# Patient Record
Sex: Female | Born: 1945 | Race: White | Hispanic: No | Marital: Married | Smoking: Never smoker
Health system: Southern US, Community
[De-identification: ages and names within clinical notes are randomized; demographics above are authoritative.]

## PROBLEM LIST (undated history)

## (undated) DIAGNOSIS — I10 Essential (primary) hypertension: Secondary | ICD-10-CM

## (undated) DIAGNOSIS — R011 Cardiac murmur, unspecified: Secondary | ICD-10-CM

## (undated) DIAGNOSIS — R002 Palpitations: Secondary | ICD-10-CM

## (undated) DIAGNOSIS — F419 Anxiety disorder, unspecified: Secondary | ICD-10-CM

## (undated) DIAGNOSIS — M1990 Unspecified osteoarthritis, unspecified site: Secondary | ICD-10-CM

## (undated) HISTORY — PX: ABDOMINAL HYSTERECTOMY: SHX81

## (undated) HISTORY — PX: WRIST SURGERY: SHX841

## (undated) HISTORY — DX: Palpitations: R00.2

## (undated) HISTORY — PX: APPENDECTOMY: SHX54

## (undated) HISTORY — PX: CHOLECYSTECTOMY: SHX55

## (undated) HISTORY — PX: EYE SURGERY: SHX253

## (undated) HISTORY — PX: TONSILLECTOMY: SUR1361

---

## 1979-05-03 HISTORY — PX: BACK SURGERY: SHX140

## 1999-10-01 ENCOUNTER — Ambulatory Visit (HOSPITAL_COMMUNITY): Admission: RE | Admit: 1999-10-01 | Discharge: 1999-10-01 | Payer: Self-pay | Admitting: Gastroenterology

## 1999-10-27 ENCOUNTER — Encounter: Payer: Self-pay | Admitting: Internal Medicine

## 1999-10-27 ENCOUNTER — Encounter: Admission: RE | Admit: 1999-10-27 | Discharge: 1999-10-27 | Payer: Self-pay | Admitting: Internal Medicine

## 2000-10-27 ENCOUNTER — Encounter: Payer: Self-pay | Admitting: Internal Medicine

## 2000-10-27 ENCOUNTER — Encounter: Admission: RE | Admit: 2000-10-27 | Discharge: 2000-10-27 | Payer: Self-pay | Admitting: Internal Medicine

## 2000-11-13 ENCOUNTER — Inpatient Hospital Stay (HOSPITAL_COMMUNITY): Admission: EM | Admit: 2000-11-13 | Discharge: 2000-11-14 | Payer: Self-pay | Admitting: Internal Medicine

## 2001-09-21 ENCOUNTER — Encounter (INDEPENDENT_AMBULATORY_CARE_PROVIDER_SITE_OTHER): Payer: Self-pay | Admitting: Specialist

## 2001-09-21 ENCOUNTER — Ambulatory Visit (HOSPITAL_BASED_OUTPATIENT_CLINIC_OR_DEPARTMENT_OTHER): Admission: RE | Admit: 2001-09-21 | Discharge: 2001-09-21 | Payer: Self-pay

## 2001-10-29 ENCOUNTER — Encounter: Payer: Self-pay | Admitting: Internal Medicine

## 2001-10-29 ENCOUNTER — Encounter: Admission: RE | Admit: 2001-10-29 | Discharge: 2001-10-29 | Payer: Self-pay | Admitting: Internal Medicine

## 2002-10-31 ENCOUNTER — Encounter: Admission: RE | Admit: 2002-10-31 | Discharge: 2002-10-31 | Payer: Self-pay | Admitting: Internal Medicine

## 2002-10-31 ENCOUNTER — Encounter: Payer: Self-pay | Admitting: Internal Medicine

## 2003-02-22 ENCOUNTER — Emergency Department (HOSPITAL_COMMUNITY): Admission: EM | Admit: 2003-02-22 | Discharge: 2003-02-23 | Payer: Self-pay | Admitting: Emergency Medicine

## 2003-02-22 ENCOUNTER — Encounter: Payer: Self-pay | Admitting: Emergency Medicine

## 2003-02-23 ENCOUNTER — Observation Stay (HOSPITAL_COMMUNITY): Admission: EM | Admit: 2003-02-23 | Discharge: 2003-02-24 | Payer: Self-pay | Admitting: *Deleted

## 2003-02-23 ENCOUNTER — Encounter: Payer: Self-pay | Admitting: Orthopedic Surgery

## 2003-09-23 ENCOUNTER — Encounter: Admission: RE | Admit: 2003-09-23 | Discharge: 2003-09-23 | Payer: Self-pay | Admitting: Internal Medicine

## 2003-10-13 ENCOUNTER — Ambulatory Visit (HOSPITAL_COMMUNITY): Admission: RE | Admit: 2003-10-13 | Discharge: 2003-10-13 | Payer: Self-pay

## 2003-10-13 ENCOUNTER — Encounter (INDEPENDENT_AMBULATORY_CARE_PROVIDER_SITE_OTHER): Payer: Self-pay | Admitting: Specialist

## 2003-12-10 ENCOUNTER — Encounter: Admission: RE | Admit: 2003-12-10 | Discharge: 2003-12-10 | Payer: Self-pay | Admitting: Internal Medicine

## 2004-11-26 ENCOUNTER — Encounter: Admission: RE | Admit: 2004-11-26 | Discharge: 2004-11-26 | Payer: Self-pay | Admitting: Gastroenterology

## 2006-10-06 ENCOUNTER — Encounter: Admission: RE | Admit: 2006-10-06 | Discharge: 2006-10-06 | Payer: Self-pay | Admitting: Internal Medicine

## 2008-05-30 ENCOUNTER — Encounter: Admission: RE | Admit: 2008-05-30 | Discharge: 2008-05-30 | Payer: Self-pay | Admitting: Internal Medicine

## 2008-09-02 ENCOUNTER — Encounter: Admission: RE | Admit: 2008-09-02 | Discharge: 2008-09-02 | Payer: Self-pay | Admitting: Orthopedic Surgery

## 2009-11-17 ENCOUNTER — Encounter: Admission: RE | Admit: 2009-11-17 | Discharge: 2009-11-17 | Payer: Self-pay | Admitting: Internal Medicine

## 2010-05-23 ENCOUNTER — Encounter: Payer: Self-pay | Admitting: Internal Medicine

## 2010-09-17 NOTE — Op Note (Signed)
NAME:  Shannon [name], Shannon [name]                         ACCOUNT NO.:  [id number]   MEDICAL RECORD NO.:  [id number]                   PATIENT TYPE:  AMB   LOCATION:  DAY                                  FACILITY:  [facility]   PHYSICIAN:  Lorre Munroe., M.D.            DATE OF BIRTH:  1946-05-02   DATE OF PROCEDURE:  10/13/2003  DATE OF DISCHARGE:                                 OPERATIVE REPORT   PREOPERATIVE DIAGNOSIS:  Abdominal pain due to biliary dyskinesia.   POSTOPERATIVE DIAGNOSIS:  Abdominal pain due to biliary dyskinesia.   OPERATION:  Laparoscopic cholecystectomy with operative cholangiogram.   SURGEON:  Lebron Conners, M.D.   ASSISTANT:  Gita Kudo, M.D.   ANESTHESIA:  General.   PROCEDURE:  After the patient was monitored and anesthetized and had routine  preparation and draping of the abdomen, I infiltrated local anesthetic just  below the umbilicus and made a short transverse incision, located the  midline fascia, and cut it in the midline for about 1.5 cm, then bluntly  entered the peritoneal cavity with a Kelly clamp.  After assuring that there  were no adhesions of viscera to that area, I put in a Hasson cannula secured  by a pursestring 0 Vicryl suture in the fascia.  After inflation of the  abdomen with CO2 I performed laparoscopy, seeing no abnormalities.  The  gallbladder had a normal appearance. I  placed a 10 mm epigastric port and  two 5 mm midabdominal ports on the right through anesthetized sites under  direct vision.  I then placed the patient head up, foot down, and tilted to  the left and retracted the fundus of the gallbladder toward the right  shoulder and pulled the infundibulum laterally.  I dissected the  hepatoduodenal ligament until a large window existed both anteriorly and  posteriorly between the cystic duct, cystic artery, and liver through the  triangle of Calot.  I then placed a clip on the cystic duct as it emerged  from the gallbladder  and made a small hole in it and inserted a  cholangiogram catheter.  The fluoroscopic cholangiogram was completely  normal with normal-sized ducts and anatomy, no filling defects, and rapid,  easy flow into the duodenum.  I then removed the cholangiogram catheter and  restored the pneumoperitoneum and then removed the cholangiogram catheter  and clipped the cystic duct with three clips distally and cut it.  I then  clipped the cystic artery with three clips and cut between the two which  were closest to the gallbladder.  I dissected the gallbladder away from the  liver utilizing cautery and got hemostasis with the cautery.  I briefly  irrigated the area to remove blood clots and a small amount of bile that had  spilled with cholangiogram.  Hemostasis was good.  The clips were secure.  After detaching the gallbladder from the liver, I removed it through the  umbilical incision and tied the pursestring suture, then removed the  remaining irrigant from the lateral gutter and  removed the lateral ports under direct vision.  I removed the epigastric  port after allowing the CO2 to escape through it and then I closed all skin  incisions with intracuticular 4-0 Vicryl and Steri-Strips and applied  bandages.  She was stable through the procedure.                                               Lorre Munroe., M.D.    WB/MEDQ  D:  10/13/2003  T:  10/13/2003  Job:  [id number]   cc:   Candyce Churn, M.D.  [address] [place]  [place] [address]  Fax: [phone]   Tasia Catchings, M.D.  [address]  [place]  [place] [address]  Fax: [phone]

## 2010-09-17 NOTE — H&P (Signed)
Shannon [name]. [facility]  Patient:    Shannon [name], Shannon [name]                      MRN: [id number] Adm. Date:  [id number] Attending:  Pearla Dubonnet CC:         Pearla Dubonnet, M.D.   History and Physical  CHIEF COMPLAINT:  Nausea, vomiting, and diarrhea.  HISTORY OF PRESENT ILLNESS:  This 65 year old white female in good health woke up early Friday morning with vomiting and severe diarrhea.  She had a temperature of 101.  Her vomiting and diarrhea persisted throughout the day on Friday, and she went to the walk-in clinic Friday night and was told she had a virus and was given Phenergan.  On Saturday she felt better, but on Sunday morning she continued to have nausea, vomiting, and watery diarrhea.  She went to the walk-in clinic again where she was told she was a little dehydrated. She was given Phenergan and Compazine suppositories.  She had a KUB and blood work done at that time.  Today, she developed increased abdominal distention in her lower abdomen associated with diffuse pain, diarrhea x 2, and vomiting x 1.  She had had a hand biscuit earlier in the day.  She went to the walk-in clinic tonight, and a KUB was read as consistent with a small-bowel obstruction, and she was sent for admission to [facility].  She does have a history of abdominal surgery.  She has had no recent antibiotics.  She denies any urinary tract symptoms such as dysuria or frequency.  ALLERGIES:  MORPHINE SULFATE causes blue streaks when given IV in her arm.  MEDICATIONS:  Climara patch weekly.  PAST MEDICAL HISTORY:  Diverticulosis and colonoscopy.  PAST SURGICAL HISTORY: 1. Appendectomy as a child. 2. 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 3. Nasal septoplasty. 4. Right knee arthroscopy in 1993, Dr. Darrelyn Hillock. 5. Back surgery, Dr. Darrelyn Hillock in 1986.  FAMILY HISTORY:  Father, lung cancer; mother, alcoholism.  SOCIAL HISTORY:  Married.  She is a  Scientist, physiological at [employer].  She drinks one alcoholic beverage a day on average.  She is a nonsmoker.  REVIEW OF SYSTEMS:  As above.  PHYSICAL EXAMINATION:  GENERAL:  She appears comfortable, lying supine.  VITAL SIGNS:  Blood pressure 110/78, temperature 97.8, heart rate in the 80s.  HEENT:  Pupils are equal, round and respond to light.  Extraocular movements are intact.  Funduscopic normal.  Oropharynx shows mild rhinitis, otherwise clear.  NECK:  Supple.  There is no lymphadenopathy and no carotid bruits.  Jugular venous pressure is normal.  LUNGS:  Clear.  HEART:  Regular rate and rhythm without murmur, gallop, or rub.  ABDOMEN:  Soft.  There is mild suprapubic tenderness without rebound or guarding.  Normal bowel sounds.  RECTAL:  Heme-negative, brown stool  EXTREMITIES: No edema.  LABORATORY DATA: Pending.  KUB shows some colonic dilation, mild.  There is no evidence of dilated small-bowel loops or air/fluid levels.  ASSESSMENT: 1. Probable gastroenteritis, doubt a small or large bowel obstruction. 2. Mild dehydration.  PLAN: 1. Admit. 2. IV fluids, clear liquids. 3. Phenergan, Dilaudid, Imodium on a p.r.n. basis. 3. Send stool for culture and C. difficile antigen.  Check CMET, CBC,    UA, magnesium, amylase, lipase. DD:  11/13/00 TD:  11/14/00 Job: [id number]

## 2010-09-17 NOTE — Discharge Summary (Signed)
[place]. [facility]  Patient:    Shannon [name], Shannon [name] Visit Number: [id number] MRN: [id number]          Service Type: MED Location: [id number] [id number] Attending Physician:  Pearla Dubonnet Proc. Date: 11/15/00 Adm. Date:  11/13/2000 Disc. Date: 11/14/2000                             Discharge Summary  PROBLEMS: 1. Gastroenteritis, likely viral. 2. Mild dehydration, resolved.  DISCHARGE MEDICATION:  Phenergan 25 mg rectally q.6-8h. p.r.n. nausea, vomiting.  HOSPITAL COURSE:  The patient is a very pleasant 65 year old female, who awakened three days prior to admission with severe vomiting and diarrhea.  She had a temperature of 101.  That evening she went to the medical walk-in clinic at [facility] at [facility], and was told she had a virus and was given Phenergan.  The next day, she felt better but on the following day she started having nausea, vomiting and watery diarrhea and was again seen at the medical walk-in clinic at [facility] at [facility] and she was told she was a little dehydration and was given Phenergan and Compazine suppositories.  She had a KUB and blood work done at that time.  On the day of admission, November 13, 2000, she developed increasingly abdominal distention in her lower abdomen associated with diffuse pain and diarrhea constituting vomiting x1.  She had had a ham biscuit earlier in the day but apparently had taken the ham out of the biscuit.  A KUB on the night of admission November 13, 2000, was read as a small-bowel obstruction and the x-ray was subsequently read at [facility] and was felt to be simply a lot of gas in the large intestine.  She does have a history of abdominal surgery but has not taken any recent antibiotics.  She denies any symptoms of urinary tract infection such as dysuria or frequency.  She was admitted and given intravenous fluid and felt much better after 12 hours.  She was able to  tolerate clear liquids on the second day on the following day, and by mid afternoon on the day after the evening of admission, she felt well enough.  She was not nauseous at all and tolerating liquids well and ambulating well.  She was discharged home in good condition with a probably diagnosis of viral gastroenteritis, resolving.  Other discharge medicines include, Climara patch topically weekly.  FOLLOWUP: She is to follow up with myself, Dr. Kevan Ny, on a p.r.n. basis.  C. difficile antigen sent was negative and laboratories including CMET, CBC, urinalysis, magnesium, amylase, and lipase were all within normal limits but did point to some mild dehydration with a slightly high BUN. Attending Physician:  Pearla Dubonnet DD:  11/15/00 TD:  11/16/00 Job: [id number]

## 2011-12-13 ENCOUNTER — Other Ambulatory Visit: Payer: Self-pay | Admitting: Internal Medicine

## 2011-12-13 DIAGNOSIS — H539 Unspecified visual disturbance: Secondary | ICD-10-CM | POA: Diagnosis not present

## 2011-12-15 ENCOUNTER — Ambulatory Visit
Admission: RE | Admit: 2011-12-15 | Discharge: 2011-12-15 | Disposition: A | Discharge status: Home / Self Care | Payer: Medicare Other | Source: Ambulatory Visit | Attending: Internal Medicine | Admitting: Internal Medicine

## 2011-12-15 DIAGNOSIS — H539 Unspecified visual disturbance: Secondary | ICD-10-CM

## 2012-02-24 DIAGNOSIS — R232 Flushing: Secondary | ICD-10-CM | POA: Diagnosis not present

## 2012-02-24 DIAGNOSIS — I10 Essential (primary) hypertension: Secondary | ICD-10-CM | POA: Diagnosis not present

## 2012-02-24 DIAGNOSIS — M25476 Effusion, unspecified foot: Secondary | ICD-10-CM | POA: Diagnosis not present

## 2012-02-24 DIAGNOSIS — Z1331 Encounter for screening for depression: Secondary | ICD-10-CM | POA: Diagnosis not present

## 2012-02-24 DIAGNOSIS — Z79899 Other long term (current) drug therapy: Secondary | ICD-10-CM | POA: Diagnosis not present

## 2012-02-24 DIAGNOSIS — R000 Tachycardia, unspecified: Secondary | ICD-10-CM | POA: Diagnosis not present

## 2012-02-24 DIAGNOSIS — E559 Vitamin D deficiency, unspecified: Secondary | ICD-10-CM | POA: Diagnosis not present

## 2012-02-24 DIAGNOSIS — E7800 Pure hypercholesterolemia, unspecified: Secondary | ICD-10-CM | POA: Diagnosis not present

## 2012-02-24 DIAGNOSIS — Z0000 Encounter for general adult medical examination without abnormal findings: Secondary | ICD-10-CM | POA: Diagnosis not present

## 2012-02-24 DIAGNOSIS — Z23 Encounter for immunization: Secondary | ICD-10-CM | POA: Diagnosis not present

## 2012-02-24 DIAGNOSIS — M25473 Effusion, unspecified ankle: Secondary | ICD-10-CM | POA: Diagnosis not present

## 2012-02-24 DIAGNOSIS — F341 Dysthymic disorder: Secondary | ICD-10-CM | POA: Diagnosis not present

## 2012-04-02 DIAGNOSIS — M5137 Other intervertebral disc degeneration, lumbosacral region: Secondary | ICD-10-CM | POA: Diagnosis not present

## 2012-05-14 DIAGNOSIS — H5200 Hypermetropia, unspecified eye: Secondary | ICD-10-CM | POA: Diagnosis not present

## 2012-05-14 DIAGNOSIS — H524 Presbyopia: Secondary | ICD-10-CM | POA: Diagnosis not present

## 2012-05-14 DIAGNOSIS — H2510 Age-related nuclear cataract, unspecified eye: Secondary | ICD-10-CM | POA: Diagnosis not present

## 2012-07-04 DIAGNOSIS — M898X9 Other specified disorders of bone, unspecified site: Secondary | ICD-10-CM | POA: Diagnosis not present

## 2012-07-04 DIAGNOSIS — M2010 Hallux valgus (acquired), unspecified foot: Secondary | ICD-10-CM | POA: Diagnosis not present

## 2012-07-28 DIAGNOSIS — L259 Unspecified contact dermatitis, unspecified cause: Secondary | ICD-10-CM | POA: Diagnosis not present

## 2013-02-05 DIAGNOSIS — Z23 Encounter for immunization: Secondary | ICD-10-CM | POA: Diagnosis not present

## 2013-05-20 DIAGNOSIS — H524 Presbyopia: Secondary | ICD-10-CM | POA: Diagnosis not present

## 2013-05-20 DIAGNOSIS — H2510 Age-related nuclear cataract, unspecified eye: Secondary | ICD-10-CM | POA: Diagnosis not present

## 2013-05-20 DIAGNOSIS — H5200 Hypermetropia, unspecified eye: Secondary | ICD-10-CM | POA: Diagnosis not present

## 2013-06-04 DIAGNOSIS — F341 Dysthymic disorder: Secondary | ICD-10-CM | POA: Diagnosis not present

## 2013-06-04 DIAGNOSIS — Z0000 Encounter for general adult medical examination without abnormal findings: Secondary | ICD-10-CM | POA: Diagnosis not present

## 2013-06-04 DIAGNOSIS — M25473 Effusion, unspecified ankle: Secondary | ICD-10-CM | POA: Diagnosis not present

## 2013-06-04 DIAGNOSIS — R232 Flushing: Secondary | ICD-10-CM | POA: Diagnosis not present

## 2013-06-04 DIAGNOSIS — I10 Essential (primary) hypertension: Secondary | ICD-10-CM | POA: Diagnosis not present

## 2013-06-04 DIAGNOSIS — E559 Vitamin D deficiency, unspecified: Secondary | ICD-10-CM | POA: Diagnosis not present

## 2013-06-04 DIAGNOSIS — E7800 Pure hypercholesterolemia, unspecified: Secondary | ICD-10-CM | POA: Diagnosis not present

## 2013-06-04 DIAGNOSIS — R000 Tachycardia, unspecified: Secondary | ICD-10-CM | POA: Diagnosis not present

## 2013-06-04 DIAGNOSIS — M25476 Effusion, unspecified foot: Secondary | ICD-10-CM | POA: Diagnosis not present

## 2013-06-04 DIAGNOSIS — Z1331 Encounter for screening for depression: Secondary | ICD-10-CM | POA: Diagnosis not present

## 2013-06-05 ENCOUNTER — Other Ambulatory Visit: Payer: Self-pay

## 2013-06-05 DIAGNOSIS — Z1231 Encounter for screening mammogram for malignant neoplasm of breast: Secondary | ICD-10-CM

## 2013-06-20 ENCOUNTER — Ambulatory Visit: Payer: Medicare Other

## 2013-07-08 ENCOUNTER — Ambulatory Visit
Admission: RE | Admit: 2013-07-08 | Discharge: 2013-07-08 | Disposition: A | Discharge status: Home / Self Care | Payer: Medicare Other | Source: Ambulatory Visit

## 2013-07-08 DIAGNOSIS — Z1231 Encounter for screening mammogram for malignant neoplasm of breast: Secondary | ICD-10-CM

## 2013-09-13 DIAGNOSIS — E7800 Pure hypercholesterolemia, unspecified: Secondary | ICD-10-CM | POA: Diagnosis not present

## 2013-09-13 DIAGNOSIS — IMO0002 Reserved for concepts with insufficient information to code with codable children: Secondary | ICD-10-CM | POA: Diagnosis not present

## 2013-11-04 DIAGNOSIS — H2510 Age-related nuclear cataract, unspecified eye: Secondary | ICD-10-CM | POA: Diagnosis not present

## 2013-12-23 DIAGNOSIS — M545 Low back pain, unspecified: Secondary | ICD-10-CM | POA: Diagnosis not present

## 2014-01-24 DIAGNOSIS — M545 Low back pain, unspecified: Secondary | ICD-10-CM | POA: Diagnosis not present

## 2014-02-10 DIAGNOSIS — Z23 Encounter for immunization: Secondary | ICD-10-CM | POA: Diagnosis not present

## 2014-04-29 DIAGNOSIS — L219 Seborrheic dermatitis, unspecified: Secondary | ICD-10-CM | POA: Diagnosis not present

## 2014-04-29 DIAGNOSIS — L570 Actinic keratosis: Secondary | ICD-10-CM | POA: Diagnosis not present

## 2014-08-14 ENCOUNTER — Other Ambulatory Visit: Payer: Self-pay

## 2014-08-14 DIAGNOSIS — Z1231 Encounter for screening mammogram for malignant neoplasm of breast: Secondary | ICD-10-CM

## 2014-08-29 ENCOUNTER — Ambulatory Visit
Admission: RE | Admit: 2014-08-29 | Discharge: 2014-08-29 | Disposition: A | Discharge status: Home / Self Care | Payer: Medicare Other | Source: Ambulatory Visit

## 2014-08-29 ENCOUNTER — Encounter (INDEPENDENT_AMBULATORY_CARE_PROVIDER_SITE_OTHER): Payer: Self-pay

## 2014-08-29 DIAGNOSIS — Z1231 Encounter for screening mammogram for malignant neoplasm of breast: Secondary | ICD-10-CM

## 2014-09-01 ENCOUNTER — Other Ambulatory Visit: Payer: Self-pay | Admitting: Internal Medicine

## 2014-09-01 DIAGNOSIS — R928 Other abnormal and inconclusive findings on diagnostic imaging of breast: Secondary | ICD-10-CM

## 2014-09-08 ENCOUNTER — Ambulatory Visit
Admission: RE | Admit: 2014-09-08 | Discharge: 2014-09-08 | Disposition: A | Discharge status: Home / Self Care | Payer: Medicare Other | Source: Ambulatory Visit | Attending: Internal Medicine | Admitting: Internal Medicine

## 2014-09-08 DIAGNOSIS — R928 Other abnormal and inconclusive findings on diagnostic imaging of breast: Secondary | ICD-10-CM

## 2015-02-12 ENCOUNTER — Other Ambulatory Visit (HOSPITAL_COMMUNITY): Payer: Self-pay | Admitting: Internal Medicine

## 2015-02-12 DIAGNOSIS — R011 Cardiac murmur, unspecified: Secondary | ICD-10-CM

## 2015-02-16 ENCOUNTER — Ambulatory Visit (HOSPITAL_COMMUNITY): Payer: Medicare Other | Attending: Cardiovascular Disease

## 2015-02-16 ENCOUNTER — Other Ambulatory Visit: Payer: Self-pay

## 2015-02-16 DIAGNOSIS — I340 Nonrheumatic mitral (valve) insufficiency: Secondary | ICD-10-CM | POA: Diagnosis not present

## 2015-02-16 DIAGNOSIS — R011 Cardiac murmur, unspecified: Secondary | ICD-10-CM | POA: Diagnosis present

## 2015-02-16 DIAGNOSIS — I351 Nonrheumatic aortic (valve) insufficiency: Secondary | ICD-10-CM | POA: Diagnosis not present

## 2015-02-16 DIAGNOSIS — I10 Essential (primary) hypertension: Secondary | ICD-10-CM | POA: Diagnosis not present

## 2015-02-16 DIAGNOSIS — E785 Hyperlipidemia, unspecified: Secondary | ICD-10-CM | POA: Insufficient documentation

## 2015-02-16 DIAGNOSIS — Z87891 Personal history of nicotine dependence: Secondary | ICD-10-CM | POA: Insufficient documentation

## 2015-06-06 DIAGNOSIS — M25511 Pain in right shoulder: Secondary | ICD-10-CM | POA: Diagnosis not present

## 2015-09-23 ENCOUNTER — Other Ambulatory Visit: Payer: Self-pay

## 2015-09-23 DIAGNOSIS — Z1231 Encounter for screening mammogram for malignant neoplasm of breast: Secondary | ICD-10-CM

## 2015-09-24 ENCOUNTER — Other Ambulatory Visit: Payer: Self-pay | Admitting: Family Medicine

## 2015-09-24 DIAGNOSIS — O358XX0 Maternal care for other (suspected) fetal abnormality and damage, not applicable or unspecified: Secondary | ICD-10-CM

## 2015-09-24 DIAGNOSIS — O35EXX0 Maternal care for other (suspected) fetal abnormality and damage, fetal genitourinary anomalies, not applicable or unspecified: Secondary | ICD-10-CM

## 2015-10-08 ENCOUNTER — Ambulatory Visit
Admission: RE | Admit: 2015-10-08 | Discharge: 2015-10-08 | Disposition: A | Discharge status: Home / Self Care | Payer: Medicare Other | Source: Ambulatory Visit

## 2015-10-08 DIAGNOSIS — Z1231 Encounter for screening mammogram for malignant neoplasm of breast: Secondary | ICD-10-CM | POA: Diagnosis not present

## 2015-11-17 DIAGNOSIS — F439 Reaction to severe stress, unspecified: Secondary | ICD-10-CM | POA: Diagnosis not present

## 2015-11-17 DIAGNOSIS — E782 Mixed hyperlipidemia: Secondary | ICD-10-CM | POA: Diagnosis not present

## 2015-11-17 DIAGNOSIS — R61 Generalized hyperhidrosis: Secondary | ICD-10-CM | POA: Diagnosis not present

## 2015-11-17 DIAGNOSIS — I10 Essential (primary) hypertension: Secondary | ICD-10-CM | POA: Diagnosis not present

## 2015-11-17 DIAGNOSIS — Z79899 Other long term (current) drug therapy: Secondary | ICD-10-CM | POA: Diagnosis not present

## 2015-11-17 DIAGNOSIS — E559 Vitamin D deficiency, unspecified: Secondary | ICD-10-CM | POA: Diagnosis not present

## 2015-11-17 DIAGNOSIS — R5383 Other fatigue: Secondary | ICD-10-CM | POA: Diagnosis not present

## 2015-11-17 DIAGNOSIS — L659 Nonscarring hair loss, unspecified: Secondary | ICD-10-CM | POA: Diagnosis not present

## 2015-11-17 DIAGNOSIS — Z1389 Encounter for screening for other disorder: Secondary | ICD-10-CM | POA: Diagnosis not present

## 2015-11-17 DIAGNOSIS — R011 Cardiac murmur, unspecified: Secondary | ICD-10-CM | POA: Diagnosis not present

## 2015-11-17 DIAGNOSIS — Z0001 Encounter for general adult medical examination with abnormal findings: Secondary | ICD-10-CM | POA: Diagnosis not present

## 2016-02-03 DIAGNOSIS — Z23 Encounter for immunization: Secondary | ICD-10-CM | POA: Diagnosis not present

## 2016-03-03 DIAGNOSIS — H2513 Age-related nuclear cataract, bilateral: Secondary | ICD-10-CM | POA: Diagnosis not present

## 2016-03-03 DIAGNOSIS — H524 Presbyopia: Secondary | ICD-10-CM | POA: Diagnosis not present

## 2016-03-03 DIAGNOSIS — H5203 Hypermetropia, bilateral: Secondary | ICD-10-CM | POA: Diagnosis not present

## 2016-03-16 DIAGNOSIS — H2511 Age-related nuclear cataract, right eye: Secondary | ICD-10-CM | POA: Diagnosis not present

## 2016-03-30 DIAGNOSIS — H2512 Age-related nuclear cataract, left eye: Secondary | ICD-10-CM | POA: Diagnosis not present

## 2016-03-30 DIAGNOSIS — H2511 Age-related nuclear cataract, right eye: Secondary | ICD-10-CM | POA: Diagnosis not present

## 2016-04-13 DIAGNOSIS — H25012 Cortical age-related cataract, left eye: Secondary | ICD-10-CM | POA: Diagnosis not present

## 2016-04-13 DIAGNOSIS — H2512 Age-related nuclear cataract, left eye: Secondary | ICD-10-CM | POA: Diagnosis not present

## 2016-04-16 DIAGNOSIS — G8929 Other chronic pain: Secondary | ICD-10-CM | POA: Diagnosis not present

## 2016-04-16 DIAGNOSIS — M25561 Pain in right knee: Secondary | ICD-10-CM | POA: Diagnosis not present

## 2016-06-01 DIAGNOSIS — M1711 Unilateral primary osteoarthritis, right knee: Secondary | ICD-10-CM | POA: Diagnosis not present

## 2016-06-01 DIAGNOSIS — M25561 Pain in right knee: Secondary | ICD-10-CM | POA: Diagnosis not present

## 2016-06-01 DIAGNOSIS — G8929 Other chronic pain: Secondary | ICD-10-CM | POA: Diagnosis not present

## 2016-06-07 DIAGNOSIS — G8929 Other chronic pain: Secondary | ICD-10-CM | POA: Diagnosis not present

## 2016-06-07 DIAGNOSIS — M25561 Pain in right knee: Secondary | ICD-10-CM | POA: Diagnosis not present

## 2016-07-07 DIAGNOSIS — M23341 Other meniscus derangements, anterior horn of lateral meniscus, right knee: Secondary | ICD-10-CM | POA: Diagnosis not present

## 2016-07-07 DIAGNOSIS — M2241 Chondromalacia patellae, right knee: Secondary | ICD-10-CM | POA: Diagnosis not present

## 2016-07-07 DIAGNOSIS — M23321 Other meniscus derangements, posterior horn of medial meniscus, right knee: Secondary | ICD-10-CM | POA: Diagnosis not present

## 2016-07-07 DIAGNOSIS — G8918 Other acute postprocedural pain: Secondary | ICD-10-CM | POA: Diagnosis not present

## 2016-07-07 DIAGNOSIS — M94261 Chondromalacia, right knee: Secondary | ICD-10-CM | POA: Diagnosis not present

## 2016-07-07 DIAGNOSIS — M23241 Derangement of anterior horn of lateral meniscus due to old tear or injury, right knee: Secondary | ICD-10-CM | POA: Diagnosis not present

## 2016-07-07 DIAGNOSIS — M23221 Derangement of posterior horn of medial meniscus due to old tear or injury, right knee: Secondary | ICD-10-CM | POA: Diagnosis not present

## 2016-07-15 DIAGNOSIS — Z4789 Encounter for other orthopedic aftercare: Secondary | ICD-10-CM | POA: Diagnosis not present

## 2016-07-15 DIAGNOSIS — M1711 Unilateral primary osteoarthritis, right knee: Secondary | ICD-10-CM | POA: Diagnosis not present

## 2016-08-22 DIAGNOSIS — Z961 Presence of intraocular lens: Secondary | ICD-10-CM | POA: Diagnosis not present

## 2016-08-24 DIAGNOSIS — M1711 Unilateral primary osteoarthritis, right knee: Secondary | ICD-10-CM | POA: Diagnosis not present

## 2016-08-24 DIAGNOSIS — Z4789 Encounter for other orthopedic aftercare: Secondary | ICD-10-CM | POA: Diagnosis not present

## 2016-10-05 DIAGNOSIS — Z4789 Encounter for other orthopedic aftercare: Secondary | ICD-10-CM | POA: Diagnosis not present

## 2016-10-05 DIAGNOSIS — G5781 Other specified mononeuropathies of right lower limb: Secondary | ICD-10-CM | POA: Diagnosis not present

## 2016-11-07 ENCOUNTER — Ambulatory Visit (HOSPITAL_COMMUNITY)
Admission: RE | Admit: 2016-11-07 | Discharge: 2016-11-07 | Disposition: A | Discharge status: Home / Self Care | Payer: Medicare Other | Source: Ambulatory Visit | Attending: Nurse Practitioner | Admitting: Nurse Practitioner

## 2016-11-07 ENCOUNTER — Other Ambulatory Visit (HOSPITAL_COMMUNITY): Payer: Self-pay | Admitting: Nurse Practitioner

## 2016-11-07 DIAGNOSIS — K760 Fatty (change of) liver, not elsewhere classified: Secondary | ICD-10-CM | POA: Diagnosis not present

## 2016-11-07 DIAGNOSIS — R1084 Generalized abdominal pain: Secondary | ICD-10-CM

## 2016-11-07 DIAGNOSIS — Z9049 Acquired absence of other specified parts of digestive tract: Secondary | ICD-10-CM | POA: Diagnosis not present

## 2016-11-10 DIAGNOSIS — R1084 Generalized abdominal pain: Secondary | ICD-10-CM | POA: Diagnosis not present

## 2016-11-10 DIAGNOSIS — K219 Gastro-esophageal reflux disease without esophagitis: Secondary | ICD-10-CM | POA: Diagnosis not present

## 2016-11-14 ENCOUNTER — Other Ambulatory Visit: Payer: Self-pay | Admitting: Internal Medicine

## 2016-11-14 DIAGNOSIS — Z1231 Encounter for screening mammogram for malignant neoplasm of breast: Secondary | ICD-10-CM

## 2016-11-22 ENCOUNTER — Ambulatory Visit: Payer: Medicare Other

## 2016-12-02 ENCOUNTER — Ambulatory Visit
Admission: RE | Admit: 2016-12-02 | Discharge: 2016-12-02 | Disposition: A | Discharge status: Home / Self Care | Payer: Medicare Other | Source: Ambulatory Visit | Attending: Internal Medicine | Admitting: Internal Medicine

## 2016-12-02 DIAGNOSIS — Z1231 Encounter for screening mammogram for malignant neoplasm of breast: Secondary | ICD-10-CM | POA: Diagnosis not present

## 2017-02-07 DIAGNOSIS — G4700 Insomnia, unspecified: Secondary | ICD-10-CM | POA: Diagnosis not present

## 2017-02-07 DIAGNOSIS — F411 Generalized anxiety disorder: Secondary | ICD-10-CM | POA: Diagnosis not present

## 2017-02-07 DIAGNOSIS — Z23 Encounter for immunization: Secondary | ICD-10-CM | POA: Diagnosis not present

## 2017-02-15 DIAGNOSIS — Z13820 Encounter for screening for osteoporosis: Secondary | ICD-10-CM | POA: Diagnosis not present

## 2017-02-15 DIAGNOSIS — Z79899 Other long term (current) drug therapy: Secondary | ICD-10-CM | POA: Diagnosis not present

## 2017-02-15 DIAGNOSIS — Z1389 Encounter for screening for other disorder: Secondary | ICD-10-CM | POA: Diagnosis not present

## 2017-02-15 DIAGNOSIS — R011 Cardiac murmur, unspecified: Secondary | ICD-10-CM | POA: Diagnosis not present

## 2017-02-15 DIAGNOSIS — I10 Essential (primary) hypertension: Secondary | ICD-10-CM | POA: Diagnosis not present

## 2017-02-15 DIAGNOSIS — Z0000 Encounter for general adult medical examination without abnormal findings: Secondary | ICD-10-CM | POA: Diagnosis not present

## 2017-02-15 DIAGNOSIS — F411 Generalized anxiety disorder: Secondary | ICD-10-CM | POA: Diagnosis not present

## 2017-02-15 DIAGNOSIS — E669 Obesity, unspecified: Secondary | ICD-10-CM | POA: Diagnosis not present

## 2017-02-15 DIAGNOSIS — E782 Mixed hyperlipidemia: Secondary | ICD-10-CM | POA: Diagnosis not present

## 2017-02-15 DIAGNOSIS — F439 Reaction to severe stress, unspecified: Secondary | ICD-10-CM | POA: Diagnosis not present

## 2017-02-15 DIAGNOSIS — E559 Vitamin D deficiency, unspecified: Secondary | ICD-10-CM | POA: Diagnosis not present

## 2017-02-15 DIAGNOSIS — R635 Abnormal weight gain: Secondary | ICD-10-CM | POA: Diagnosis not present

## 2017-02-15 DIAGNOSIS — G4700 Insomnia, unspecified: Secondary | ICD-10-CM | POA: Diagnosis not present

## 2017-03-07 DIAGNOSIS — G8929 Other chronic pain: Secondary | ICD-10-CM | POA: Diagnosis not present

## 2017-03-07 DIAGNOSIS — M25511 Pain in right shoulder: Secondary | ICD-10-CM | POA: Diagnosis not present

## 2017-06-09 DIAGNOSIS — K219 Gastro-esophageal reflux disease without esophagitis: Secondary | ICD-10-CM | POA: Diagnosis not present

## 2017-06-09 DIAGNOSIS — R1084 Generalized abdominal pain: Secondary | ICD-10-CM | POA: Diagnosis not present

## 2017-06-27 DIAGNOSIS — M1712 Unilateral primary osteoarthritis, left knee: Secondary | ICD-10-CM | POA: Diagnosis not present

## 2017-06-27 DIAGNOSIS — M170 Bilateral primary osteoarthritis of knee: Secondary | ICD-10-CM | POA: Diagnosis not present

## 2017-06-27 DIAGNOSIS — M25511 Pain in right shoulder: Secondary | ICD-10-CM | POA: Diagnosis not present

## 2017-06-27 DIAGNOSIS — M1711 Unilateral primary osteoarthritis, right knee: Secondary | ICD-10-CM | POA: Diagnosis not present

## 2017-08-04 DIAGNOSIS — R1030 Lower abdominal pain, unspecified: Secondary | ICD-10-CM | POA: Diagnosis not present

## 2017-08-04 DIAGNOSIS — K5790 Diverticulosis of intestine, part unspecified, without perforation or abscess without bleeding: Secondary | ICD-10-CM | POA: Diagnosis not present

## 2017-08-24 DIAGNOSIS — Z961 Presence of intraocular lens: Secondary | ICD-10-CM | POA: Diagnosis not present

## 2017-09-08 DIAGNOSIS — K5790 Diverticulosis of intestine, part unspecified, without perforation or abscess without bleeding: Secondary | ICD-10-CM | POA: Diagnosis not present

## 2017-09-08 DIAGNOSIS — R21 Rash and other nonspecific skin eruption: Secondary | ICD-10-CM | POA: Diagnosis not present

## 2017-09-08 DIAGNOSIS — R1030 Lower abdominal pain, unspecified: Secondary | ICD-10-CM | POA: Diagnosis not present

## 2017-09-11 DIAGNOSIS — K5790 Diverticulosis of intestine, part unspecified, without perforation or abscess without bleeding: Secondary | ICD-10-CM | POA: Diagnosis not present

## 2017-09-28 DIAGNOSIS — K219 Gastro-esophageal reflux disease without esophagitis: Secondary | ICD-10-CM | POA: Diagnosis not present

## 2017-09-28 DIAGNOSIS — R197 Diarrhea, unspecified: Secondary | ICD-10-CM | POA: Diagnosis not present

## 2017-10-13 DIAGNOSIS — K2930 Chronic superficial gastritis without bleeding: Secondary | ICD-10-CM | POA: Diagnosis not present

## 2017-10-13 DIAGNOSIS — K635 Polyp of colon: Secondary | ICD-10-CM | POA: Diagnosis not present

## 2017-10-13 DIAGNOSIS — D126 Benign neoplasm of colon, unspecified: Secondary | ICD-10-CM | POA: Diagnosis not present

## 2017-10-13 DIAGNOSIS — K3189 Other diseases of stomach and duodenum: Secondary | ICD-10-CM | POA: Diagnosis not present

## 2017-10-19 DIAGNOSIS — D126 Benign neoplasm of colon, unspecified: Secondary | ICD-10-CM | POA: Diagnosis not present

## 2017-10-19 DIAGNOSIS — K2930 Chronic superficial gastritis without bleeding: Secondary | ICD-10-CM | POA: Diagnosis not present

## 2017-10-19 DIAGNOSIS — K635 Polyp of colon: Secondary | ICD-10-CM | POA: Diagnosis not present

## 2017-10-19 DIAGNOSIS — K3189 Other diseases of stomach and duodenum: Secondary | ICD-10-CM | POA: Diagnosis not present

## 2017-10-23 ENCOUNTER — Other Ambulatory Visit: Payer: Self-pay | Admitting: Internal Medicine

## 2017-10-23 DIAGNOSIS — Z1231 Encounter for screening mammogram for malignant neoplasm of breast: Secondary | ICD-10-CM

## 2017-10-25 DIAGNOSIS — K580 Irritable bowel syndrome with diarrhea: Secondary | ICD-10-CM | POA: Diagnosis not present

## 2017-10-25 DIAGNOSIS — D126 Benign neoplasm of colon, unspecified: Secondary | ICD-10-CM | POA: Diagnosis not present

## 2017-11-22 DIAGNOSIS — M25511 Pain in right shoulder: Secondary | ICD-10-CM | POA: Diagnosis not present

## 2017-12-04 ENCOUNTER — Ambulatory Visit
Admission: RE | Admit: 2017-12-04 | Discharge: 2017-12-04 | Disposition: A | Discharge status: Home / Self Care | Payer: Medicare Other | Source: Ambulatory Visit | Attending: Internal Medicine | Admitting: Internal Medicine

## 2017-12-04 DIAGNOSIS — Z1231 Encounter for screening mammogram for malignant neoplasm of breast: Secondary | ICD-10-CM | POA: Diagnosis not present

## 2018-01-16 DIAGNOSIS — Z23 Encounter for immunization: Secondary | ICD-10-CM | POA: Diagnosis not present

## 2018-02-07 DIAGNOSIS — M25511 Pain in right shoulder: Secondary | ICD-10-CM | POA: Diagnosis not present

## 2018-02-27 DIAGNOSIS — K3189 Other diseases of stomach and duodenum: Secondary | ICD-10-CM | POA: Diagnosis not present

## 2018-02-27 DIAGNOSIS — K5790 Diverticulosis of intestine, part unspecified, without perforation or abscess without bleeding: Secondary | ICD-10-CM | POA: Diagnosis not present

## 2018-02-27 DIAGNOSIS — E782 Mixed hyperlipidemia: Secondary | ICD-10-CM | POA: Diagnosis not present

## 2018-02-27 DIAGNOSIS — Z79899 Other long term (current) drug therapy: Secondary | ICD-10-CM | POA: Diagnosis not present

## 2018-02-27 DIAGNOSIS — Z1389 Encounter for screening for other disorder: Secondary | ICD-10-CM | POA: Diagnosis not present

## 2018-02-27 DIAGNOSIS — G4700 Insomnia, unspecified: Secondary | ICD-10-CM | POA: Diagnosis not present

## 2018-02-27 DIAGNOSIS — R635 Abnormal weight gain: Secondary | ICD-10-CM | POA: Diagnosis not present

## 2018-02-27 DIAGNOSIS — Z0000 Encounter for general adult medical examination without abnormal findings: Secondary | ICD-10-CM | POA: Diagnosis not present

## 2018-02-27 DIAGNOSIS — F439 Reaction to severe stress, unspecified: Secondary | ICD-10-CM | POA: Diagnosis not present

## 2018-02-27 DIAGNOSIS — R011 Cardiac murmur, unspecified: Secondary | ICD-10-CM | POA: Diagnosis not present

## 2018-02-27 DIAGNOSIS — D126 Benign neoplasm of colon, unspecified: Secondary | ICD-10-CM | POA: Diagnosis not present

## 2018-02-27 DIAGNOSIS — I10 Essential (primary) hypertension: Secondary | ICD-10-CM | POA: Diagnosis not present

## 2018-02-27 DIAGNOSIS — E559 Vitamin D deficiency, unspecified: Secondary | ICD-10-CM | POA: Diagnosis not present

## 2018-02-28 ENCOUNTER — Other Ambulatory Visit: Payer: Self-pay | Admitting: Internal Medicine

## 2018-02-28 DIAGNOSIS — Z13820 Encounter for screening for osteoporosis: Secondary | ICD-10-CM

## 2018-04-03 DIAGNOSIS — M25511 Pain in right shoulder: Secondary | ICD-10-CM | POA: Diagnosis not present

## 2018-05-07 ENCOUNTER — Other Ambulatory Visit: Payer: Medicare Other

## 2018-09-10 DIAGNOSIS — M25511 Pain in right shoulder: Secondary | ICD-10-CM | POA: Diagnosis not present

## 2018-10-09 DIAGNOSIS — I10 Essential (primary) hypertension: Secondary | ICD-10-CM | POA: Diagnosis not present

## 2018-10-09 DIAGNOSIS — F439 Reaction to severe stress, unspecified: Secondary | ICD-10-CM | POA: Diagnosis not present

## 2018-10-09 DIAGNOSIS — G4700 Insomnia, unspecified: Secondary | ICD-10-CM | POA: Diagnosis not present

## 2018-10-09 DIAGNOSIS — K521 Toxic gastroenteritis and colitis: Secondary | ICD-10-CM | POA: Diagnosis not present

## 2018-10-12 DIAGNOSIS — R197 Diarrhea, unspecified: Secondary | ICD-10-CM | POA: Diagnosis not present

## 2018-10-16 DIAGNOSIS — I10 Essential (primary) hypertension: Secondary | ICD-10-CM | POA: Diagnosis not present

## 2018-10-16 DIAGNOSIS — G4700 Insomnia, unspecified: Secondary | ICD-10-CM | POA: Diagnosis not present

## 2018-10-16 DIAGNOSIS — F439 Reaction to severe stress, unspecified: Secondary | ICD-10-CM | POA: Diagnosis not present

## 2018-10-16 DIAGNOSIS — K521 Toxic gastroenteritis and colitis: Secondary | ICD-10-CM | POA: Diagnosis not present

## 2018-10-29 ENCOUNTER — Other Ambulatory Visit: Payer: Self-pay | Admitting: Internal Medicine

## 2018-10-29 DIAGNOSIS — Z1231 Encounter for screening mammogram for malignant neoplasm of breast: Secondary | ICD-10-CM

## 2018-11-22 DIAGNOSIS — Z961 Presence of intraocular lens: Secondary | ICD-10-CM | POA: Diagnosis not present

## 2018-12-11 ENCOUNTER — Ambulatory Visit
Admission: RE | Admit: 2018-12-11 | Discharge: 2018-12-11 | Disposition: A | Discharge status: Home / Self Care | Payer: Medicare Other | Source: Ambulatory Visit | Attending: Gastroenterology | Admitting: Gastroenterology

## 2018-12-11 ENCOUNTER — Other Ambulatory Visit: Payer: Self-pay | Admitting: Gastroenterology

## 2018-12-11 DIAGNOSIS — K5900 Constipation, unspecified: Secondary | ICD-10-CM | POA: Diagnosis not present

## 2018-12-11 DIAGNOSIS — R1084 Generalized abdominal pain: Secondary | ICD-10-CM

## 2018-12-16 DIAGNOSIS — Z23 Encounter for immunization: Secondary | ICD-10-CM | POA: Diagnosis not present

## 2019-02-13 ENCOUNTER — Ambulatory Visit: Payer: Medicare Other

## 2019-03-08 ENCOUNTER — Other Ambulatory Visit: Payer: Self-pay

## 2019-03-08 ENCOUNTER — Ambulatory Visit
Admission: RE | Admit: 2019-03-08 | Discharge: 2019-03-08 | Disposition: A | Discharge status: Home / Self Care | Payer: Medicare Other | Source: Ambulatory Visit | Attending: Internal Medicine | Admitting: Internal Medicine

## 2019-03-08 DIAGNOSIS — Z1231 Encounter for screening mammogram for malignant neoplasm of breast: Secondary | ICD-10-CM | POA: Diagnosis not present

## 2019-03-20 DIAGNOSIS — Z0000 Encounter for general adult medical examination without abnormal findings: Secondary | ICD-10-CM | POA: Diagnosis not present

## 2019-03-20 DIAGNOSIS — L659 Nonscarring hair loss, unspecified: Secondary | ICD-10-CM | POA: Diagnosis not present

## 2019-03-20 DIAGNOSIS — E782 Mixed hyperlipidemia: Secondary | ICD-10-CM | POA: Diagnosis not present

## 2019-03-20 DIAGNOSIS — F439 Reaction to severe stress, unspecified: Secondary | ICD-10-CM | POA: Diagnosis not present

## 2019-03-20 DIAGNOSIS — Z1389 Encounter for screening for other disorder: Secondary | ICD-10-CM | POA: Diagnosis not present

## 2019-03-20 DIAGNOSIS — J309 Allergic rhinitis, unspecified: Secondary | ICD-10-CM | POA: Diagnosis not present

## 2019-03-20 DIAGNOSIS — F341 Dysthymic disorder: Secondary | ICD-10-CM | POA: Diagnosis not present

## 2019-03-20 DIAGNOSIS — I10 Essential (primary) hypertension: Secondary | ICD-10-CM | POA: Diagnosis not present

## 2019-03-20 DIAGNOSIS — G4700 Insomnia, unspecified: Secondary | ICD-10-CM | POA: Diagnosis not present

## 2019-03-20 DIAGNOSIS — E559 Vitamin D deficiency, unspecified: Secondary | ICD-10-CM | POA: Diagnosis not present

## 2019-03-20 DIAGNOSIS — R000 Tachycardia, unspecified: Secondary | ICD-10-CM | POA: Diagnosis not present

## 2019-03-20 DIAGNOSIS — K219 Gastro-esophageal reflux disease without esophagitis: Secondary | ICD-10-CM | POA: Diagnosis not present

## 2019-03-20 DIAGNOSIS — Z79899 Other long term (current) drug therapy: Secondary | ICD-10-CM | POA: Diagnosis not present

## 2019-03-20 DIAGNOSIS — K521 Toxic gastroenteritis and colitis: Secondary | ICD-10-CM | POA: Diagnosis not present

## 2019-04-02 DIAGNOSIS — M25511 Pain in right shoulder: Secondary | ICD-10-CM | POA: Diagnosis not present

## 2019-05-22 ENCOUNTER — Ambulatory Visit: Payer: Medicare Other | Attending: Internal Medicine

## 2019-05-22 DIAGNOSIS — Z23 Encounter for immunization: Secondary | ICD-10-CM | POA: Insufficient documentation

## 2019-05-22 NOTE — Progress Notes (Signed)
   Covid-19 Vaccination Clinic  Name:  Shannon [name]    MRN: [id number] DOB: 23-May-1945  05/22/2019  [name] was observed post Covid-19 immunization for 15 minutes without incidence. She was provided with Vaccine Information Sheet and instruction to access the V-Safe system.   [name] was instructed to call 911 with any severe reactions post vaccine: Marland Kitchen Difficulty breathing  . Swelling of your face and throat  . A fast heartbeat  . A bad rash all over your body  . Dizziness and weakness    Immunizations Administered    Name Date Dose VIS Date Route   Pfizer COVID-19 Vaccine 05/22/2019  1:28 PM 0.3 mL 04/12/2019 Intramuscular   Manufacturer: [place]   Lot: [id number]   [place]: [id number]

## 2019-06-10 DIAGNOSIS — R351 Nocturia: Secondary | ICD-10-CM | POA: Diagnosis not present

## 2019-06-10 DIAGNOSIS — R109 Unspecified abdominal pain: Secondary | ICD-10-CM | POA: Diagnosis not present

## 2019-06-11 DIAGNOSIS — R102 Pelvic and perineal pain: Secondary | ICD-10-CM | POA: Diagnosis not present

## 2019-06-11 DIAGNOSIS — N898 Other specified noninflammatory disorders of vagina: Secondary | ICD-10-CM | POA: Diagnosis not present

## 2019-06-11 DIAGNOSIS — R351 Nocturia: Secondary | ICD-10-CM | POA: Diagnosis not present

## 2019-06-11 DIAGNOSIS — N952 Postmenopausal atrophic vaginitis: Secondary | ICD-10-CM | POA: Diagnosis not present

## 2019-06-12 ENCOUNTER — Ambulatory Visit: Payer: Medicare Other | Attending: Internal Medicine

## 2019-06-12 DIAGNOSIS — Z23 Encounter for immunization: Secondary | ICD-10-CM

## 2019-06-12 NOTE — Progress Notes (Signed)
   Covid-19 Vaccination Clinic  Name:  Shannon [name]    MRN: [id number] DOB: Feb 08, 1946  06/12/2019  [name] was observed post Covid-19 immunization for 15 minutes without incidence. She was provided with Vaccine Information Sheet and instruction to access the V-Safe system.   [name] was instructed to call 911 with any severe reactions post vaccine: Marland Kitchen Difficulty breathing  . Swelling of your face and throat  . A fast heartbeat  . A bad rash all over your body  . Dizziness and weakness    Immunizations Administered    Name Date Dose VIS Date Route   Pfizer COVID-19 Vaccine 06/12/2019  2:09 PM 0.3 mL 04/12/2019 Intramuscular   Manufacturer: [place]   Lot: [id number]   [place]: [id number]

## 2019-06-19 DIAGNOSIS — R102 Pelvic and perineal pain: Secondary | ICD-10-CM | POA: Diagnosis not present

## 2019-06-19 DIAGNOSIS — N952 Postmenopausal atrophic vaginitis: Secondary | ICD-10-CM | POA: Diagnosis not present

## 2019-06-19 DIAGNOSIS — R351 Nocturia: Secondary | ICD-10-CM | POA: Diagnosis not present

## 2019-06-19 DIAGNOSIS — R350 Frequency of micturition: Secondary | ICD-10-CM | POA: Diagnosis not present

## 2019-06-28 ENCOUNTER — Ambulatory Visit: Payer: Medicare Other

## 2019-08-05 DIAGNOSIS — M25562 Pain in left knee: Secondary | ICD-10-CM | POA: Diagnosis not present

## 2019-08-05 DIAGNOSIS — M25561 Pain in right knee: Secondary | ICD-10-CM | POA: Diagnosis not present

## 2019-09-12 NOTE — Patient Instructions (Addendum)
DUE TO COVID-19 ONLY ONE VISITOR IS ALLOWED TO COME WITH YOU AND STAY IN THE WAITING ROOM ONLY DURING PRE OP AND PROCEDURE DAY OF SURGERY. THE 1 VISITOR MAY VISIT WITH YOU AFTER SURGERY IN YOUR PRIVATE ROOM DURING VISITING HOURS ONLY!  YOU NEED TO HAVE A COVID 19 TEST ON 09-23-19 @ 10:10 AM, THIS TEST MUST BE DONE BEFORE SURGERY, COME  [place], [place]  , [id number].  ([place]) ONCE YOUR COVID TEST IS COMPLETED, PLEASE BEGIN THE QUARANTINE INSTRUCTIONS AS OUTLINED IN YOUR HANDOUT.                Shannon [name]  09/12/2019   Your procedure is scheduled on: 09-26-19   Report to [facility] Main  Entrance    Report to Admitting at 6:10 AM     Call this number if you have problems the morning of surgery [phone]    Remember: AFTER MIDNIGHT THE NIGHT PRIOR TO SURGERY. NOTHING BY MOUTH EXCEPT CLEAR LIQUIDS UNTIL 5:40 AM . PLEASE FINISH ENSURE DRINK PER SURGEON ORDER  WHICH NEEDS TO BE COMPLETED AT 5:40 AM .   CLEAR LIQUID DIET   Foods Allowed                                                                     Foods Excluded  Coffee and tea, regular and decaf                             liquids that you cannot  Plain Jell-O any favor except red or purple                                           see through such as: Fruit ices (not with fruit pulp)                                     milk, soups, orange juice  Iced Popsicles                                    All solid food Carbonated beverages, regular and diet                                    Cranberry, grape and apple juices Sports drinks like Gatorade Lightly seasoned clear broth or consume(fat free) Sugar, honey syrup   _____________________________________________________________________     Take these medicines the morning of surgery with A SIP OF WATER: None  BRUSH YOUR TEETH MORNING OF SURGERY AND RINSE YOUR MOUTH OUT, NO CHEWING GUM CANDY OR MINTS.                       You may not have any metal on your body including hair pins and  piercings     Do not wear jewelry, make-up, lotions, powders or perfumes, deodorant              Do not wear nail polish on your fingernails.  Do not shave  48 hours prior to surgery.                 Do not bring valuables to the hospital. [place].  Contacts, dentures or bridgework may not be worn into surgery.      Patients discharged the day of surgery will not be allowed to drive home. IF YOU ARE HAVING SURGERY AND GOING HOME THE SAME DAY, YOU MUST HAVE AN ADULT TO DRIVE YOU HOME AND BE WITH YOU FOR 24 HOURS. YOU MAY GO HOME BY TAXI OR UBER OR ORTHERWISE, BUT AN ADULT MUST ACCOMPANY YOU HOME AND STAY WITH YOU FOR 24 HOURS.  Name and phone number of your driver: Shannon [name] [phone]  Special Instructions: N/A              Please read over the following fact sheets you were given: _____________________________________________________________________              [facility] - Preparing for Surgery Before surgery, you can play an important role.  Because skin is not sterile, your skin needs to be as free of germs as possible.  You can reduce the number of germs on your skin by washing with CHG (chlorahexidine gluconate) soap before surgery.  CHG is an antiseptic cleaner which kills germs and bonds with the skin to continue killing germs even after washing. Please DO NOT use if you have an allergy to CHG or antibacterial soaps.  If your skin becomes reddened/irritated stop using the CHG and inform your nurse when you arrive at Short Stay. Do not shave (including legs and underarms) for at least 48 hours prior to the first CHG shower.  You may shave your face/neck. Please follow these instructions carefully:  1.  Shower with CHG Soap the night before surgery and the  morning of Surgery.  2.  If you choose to wash your hair, wash your hair first as usual  with your  normal  shampoo.  3.  After you shampoo, rinse your hair and body thoroughly to remove the  shampoo.                           4.  Use CHG as you would any other liquid soap.  You can apply chg directly  to the skin and wash                       Gently with a scrungie or clean washcloth.  5.  Apply the CHG Soap to your body ONLY FROM THE NECK DOWN.   Do not use on face/ open                           Wound or open sores. Avoid contact with eyes, ears mouth and genitals (private parts).                       Wash face,  Genitals (private parts) with your normal soap.             6.  Wash thoroughly, paying special attention to the area where your surgery  will be performed.  7.  Thoroughly rinse your body with warm water from the neck down.  8.  DO NOT shower/wash with your normal soap after using and rinsing off  the CHG Soap.                9.  Pat yourself dry with a clean towel.            10.  Wear clean pajamas.            11.  Place clean sheets on your bed the night of your first shower and do not  sleep with pets. Day of Surgery : Do not apply any lotions/deodorants the morning of surgery.  Please wear clean clothes to the hospital/surgery center.  FAILURE TO FOLLOW THESE INSTRUCTIONS MAY RESULT IN THE CANCELLATION OF YOUR SURGERY PATIENT SIGNATURE_________________________________  NURSE SIGNATURE__________________________________  ________________________________________________________________________   Shannon [name]  An incentive spirometer is a tool that can help keep your lungs clear and active. This tool measures how well you are filling your lungs with each breath. Taking long deep breaths may help reverse or decrease the chance of developing breathing (pulmonary) problems (especially infection) following:  A long period of time when you are unable to move or be active. BEFORE THE PROCEDURE   If the spirometer includes an indicator to show your best  effort, your nurse or respiratory therapist will set it to a desired goal.  If possible, sit up straight or lean slightly forward. Try not to slouch.  Hold the incentive spirometer in an upright position. INSTRUCTIONS FOR USE  1. Sit on the edge of your bed if possible, or sit up as far as you can in bed or on a chair. 2. Hold the incentive spirometer in an upright position. 3. Breathe out normally. 4. Place the mouthpiece in your mouth and seal your lips tightly around it. 5. Breathe in slowly and as deeply as possible, raising the piston or the ball toward the top of the column. 6. Hold your breath for 3-5 seconds or for as long as possible. Allow the piston or ball to fall to the bottom of the column. 7. Remove the mouthpiece from your mouth and breathe out normally. 8. Rest for a few seconds and repeat Steps 1 through 7 at least 10 times every 1-2 hours when you are awake. Take your time and take a few normal breaths between deep breaths. 9. The spirometer may include an indicator to show your best effort. Use the indicator as a goal to work toward during each repetition. 10. After each set of 10 deep breaths, practice coughing to be sure your lungs are clear. If you have an incision (the cut made at the time of surgery), support your incision when coughing by placing a pillow or rolled up towels firmly against it. Once you are able to get out of bed, walk around indoors and cough well. You may stop using the incentive spirometer when instructed by your caregiver.  RISKS AND COMPLICATIONS  Take your time so you do not get dizzy or light-headed.  If you are in pain, you may need to take or ask for pain medication before doing incentive spirometry. It is harder to take a deep breath if you are having pain. AFTER USE  Rest and breathe slowly and easily.  It can be helpful to keep track of a log of your progress. Your  caregiver can provide you with a simple table to help with this. If you  are using the spirometer at home, follow these instructions: Early IF:   You are having difficultly using the spirometer.  You have trouble using the spirometer as often as instructed.  Your pain medication is not giving enough relief while using the spirometer.  You develop fever of 100.5 F (38.1 C) or higher. SEEK IMMEDIATE MEDICAL CARE IF:   You cough up bloody sputum that had not been present before.  You develop fever of 102 F (38.9 C) or greater.  You develop worsening pain at or near the incision site. MAKE SURE YOU:   Understand these instructions.  Will watch your condition.  Will get help right away if you are not doing well or get worse. Document Released: 08/29/2006 Document Revised: 07/11/2011 Document Reviewed: 10/30/2006 ExitCare Patient Information 2014 ExitCare, [place].   ________________________________________________________________________  WHAT IS A BLOOD TRANSFUSION? Blood Transfusion Information  A transfusion is the replacement of blood or some of its parts. Blood is made up of multiple cells which provide different functions.  Red blood cells carry oxygen and are used for blood loss replacement.  White blood cells fight against infection.  Platelets control bleeding.  Plasma helps clot blood.  Other blood products are available for specialized needs, such as hemophilia or other clotting disorders. BEFORE THE TRANSFUSION  Who gives blood for transfusions?   Healthy volunteers who are fully evaluated to make sure their blood is safe. This is blood bank blood. Transfusion therapy is the safest it has ever been in the practice of medicine. Before blood is taken from a donor, a complete history is taken to make sure that person has no history of diseases nor engages in risky social behavior (examples are intravenous drug use or sexual activity with multiple partners). The donor's travel history is screened to minimize risk of  transmitting infections, such as malaria. The donated blood is tested for signs of infectious diseases, such as HIV and hepatitis. The blood is then tested to be sure it is compatible with you in order to minimize the chance of a transfusion reaction. If you or a relative donates blood, this is often done in anticipation of surgery and is not appropriate for emergency situations. It takes many days to process the donated blood. RISKS AND COMPLICATIONS Although transfusion therapy is very safe and saves many lives, the main dangers of transfusion include:   Getting an infectious disease.  Developing a transfusion reaction. This is an allergic reaction to something in the blood you were given. Every precaution is taken to prevent this. The decision to have a blood transfusion has been considered carefully by your caregiver before blood is given. Blood is not given unless the benefits outweigh the risks. AFTER THE TRANSFUSION  Right after receiving a blood transfusion, you will usually feel much better and more energetic. This is especially true if your red blood cells have gotten low (anemic). The transfusion raises the level of the red blood cells which carry oxygen, and this usually causes an energy increase.  The nurse administering the transfusion will monitor you carefully for complications. HOME CARE INSTRUCTIONS  No special instructions are needed after a transfusion. You may find your energy is better. Speak with your caregiver about any limitations on activity for underlying diseases you may have. SEEK MEDICAL CARE IF:   Your condition is not improving after your transfusion.  You develop redness or irritation at  the intravenous (IV) site. SEEK IMMEDIATE MEDICAL CARE IF:  Any of the following symptoms occur over the next 12 hours:  Shaking chills.  You have a temperature by mouth above 102 F (38.9 C), not controlled by medicine.  Chest, back, or muscle pain.  People around you  feel you are not acting correctly or are confused.  Shortness of breath or difficulty breathing.  Dizziness and fainting.  You get a rash or develop hives.  You have a decrease in urine output.  Your urine turns a dark color or changes to pink, red, or brown. Any of the following symptoms occur over the next 10 days:  You have a temperature by mouth above 102 F (38.9 C), not controlled by medicine.  Shortness of breath.  Weakness after normal activity.  The white part of the eye turns yellow (jaundice).  You have a decrease in the amount of urine or are urinating less often.  Your urine turns a dark color or changes to pink, red, or brown. Document Released: 04/15/2000 Document Revised: 07/11/2011 Document Reviewed: 12/03/2007 [facility] Patient Information 2014 [place], [place].  _______________________________________________________________________

## 2019-09-12 NOTE — Progress Notes (Signed)
PCP - Josetta Huddle, MD Cardiologist - N/A  No stimulator  Chest x-ray -  EKG -  Stress Test -  ECHO -  Cardiac Cath -   Sleep Study -  CPAP -   Fasting Blood Sugar -  Checks Blood Sugar _____ times a day  Blood Thinner Instructions: Aspirin Instructions: Last Dose:  Anesthesia review:   Patient denies shortness of breath, fever, cough and chest pain at PAT appointment   Patient verbalized understanding of instructions that were given to them at the PAT appointment. Patient was also instructed that they will need to review over the PAT instructions again at home before surgery.

## 2019-09-15 NOTE — H&P (Signed)
TOTAL KNEE ADMISSION H&P  Patient is being admitted for left total knee arthroplasty.  Subjective:  Chief Complaint:  Left knee primary OA / pain  HPI: Shannon [name], 74 y.o. female, has a history of pain and functional disability in the left knee due to arthritis and has failed non-surgical conservative treatments for greater than 12 weeks to include NSAID's and/or analgesics, use of assistive devices and activity modification.  Onset of symptoms was gradual, starting <1 year ago with gradually worsening course since that time. The patient noted prior procedures on the knee to include  arthroscopy on the left knee(s).  Patient currently rates pain in the left knee(s) at 8 out of 10 with activity. Patient has night pain, worsening of pain with activity and weight bearing, pain that interferes with activities of daily living, pain with passive range of motion, crepitus and joint swelling.  Patient has evidence of periarticular osteophytes and joint space narrowing by imaging studies.  There is no active infection.  Risks, benefits and expectations were discussed with the patient.  Risks including but not limited to the risk of anesthesia, blood clots, nerve damage, blood vessel damage, failure of the prosthesis, infection and up to and including death.  Patient understand the risks, benefits and expectations and wishes to proceed with surgery.   PCP: Josetta Huddle, MD  D/C Plans:       Home (same day)  Post-op Meds:       Rx given for ASA, Robaxin, Norco, Celebrex, Iron, Colace and MiraLax  Tranexamic Acid:      To be given - IV   Decadron:      Is to be given  FYI:     ASA  Norco  Fentanyl (morphine allergy)  DME:   Pt equipment arranged  PT:   OPPT  Pharmacy: [place].    Past Medical History:  Diagnosis Date  . Anxiety   . Arthritis   . Heart murmur   . Hypertension     Past Surgical History:  Procedure Laterality Date  . ABDOMINAL HYSTERECTOMY    . APPENDECTOMY     . BACK SURGERY  1981  . CHOLECYSTECTOMY    . EYE SURGERY Bilateral    Cataract  . TONSILLECTOMY    . WRIST SURGERY Right     No current facility-administered medications for this encounter.   Current Outpatient Medications  Medication Sig Dispense Refill Last Dose  . ALPRAZolam (XANAX) 0.25 MG tablet Take 0.125-0.25 mg by mouth at bedtime.      . Cholecalciferol (VITAMIN D3) 50 MCG (2000 UT) TABS Take 2,000 mg by mouth daily.     Marland Kitchen ibuprofen (ADVIL) 200 MG tablet Take 200 mg by mouth every 6 (six) hours as needed for headache.     . losartan-hydrochlorothiazide (HYZAAR) 100-12.5 MG tablet Take 1 tablet by mouth daily.     . rosuvastatin (CRESTOR) 20 MG tablet Take 20 mg by mouth every other day.     . verapamil (CALAN-SR) 240 MG CR tablet Take 240 mg by mouth daily.     . vitamin B-12 (CYANOCOBALAMIN) 1000 MCG tablet Take 1,000 mcg by mouth daily.      Allergies  Allergen Reactions  . Cephalexin Hives  . Morphine Other (See Comments) and Palpitations  . Penicillins Hives  . Epinephrine Other (See Comments) and Palpitations  . Procaine Palpitations    Social History   Tobacco Use  . Smoking status: Never Smoker  . Smokeless tobacco:  Never Used  Substance Use Topics  . Alcohol use: Not on file    Comment: daily w/dinner       Review of Systems  Constitutional: Negative.   HENT: Negative.   Eyes: Negative.   Respiratory: Negative.   Cardiovascular: Negative.   Gastrointestinal: Negative.   Genitourinary: Negative.   Musculoskeletal: Positive for joint pain.  Skin: Negative.   Neurological: Negative.   Endo/Heme/Allergies: Negative.   Psychiatric/Behavioral: The patient is nervous/anxious.      Objective:  Physical Exam  Constitutional: She is oriented to person, place, and time. She appears well-developed.  HENT:  Head: Normocephalic.  Eyes: Pupils are equal, round, and reactive to light.  Neck: No JVD present. No tracheal deviation present. No thyromegaly  present.  Cardiovascular: Regular rhythm and intact distal pulses.  Murmur heard. Respiratory: Effort normal and breath sounds normal. No respiratory distress. She has no wheezes.  GI: Soft. There is no abdominal tenderness. There is no guarding.  Musculoskeletal:     Cervical back: Neck supple.     Left knee: Swelling and bony tenderness present. No deformity, erythema, ecchymosis or lacerations. Tenderness present.  Lymphadenopathy:    She has no cervical adenopathy.  Neurological: She is alert and oriented to person, place, and time.  Skin: Skin is warm and dry.  Psychiatric: She has a normal mood and affect.      Imaging Review Plain radiographs demonstrate severe degenerative joint disease of the left knee. The bone quality appears to be good for age and reported activity level.      Assessment/Plan:  End stage arthritis, left knee   The patient history, physical examination, clinical judgment of the provider and imaging studies are consistent with end stage degenerative joint disease of the  knee and total knee arthroplasty is deemed medically necessary. The treatment options including medical management, injection therapy arthroscopy and arthroplasty were discussed at length. The risks and benefits of total knee arthroplasty were presented and reviewed. The risks due to aseptic loosening, infection, stiffness, patella tracking problems, thromboembolic complications and other imponderables were discussed. The patient acknowledged the explanation, agreed to proceed with the plan and consent was signed. Patient is being admitted for inpatient treatment for surgery, pain control, PT, OT, prophylactic antibiotics, VTE prophylaxis, progressive ambulation and ADL's and discharge planning. The patient is planning to be discharged home.     Patient's anticipated LOS is less than 2 midnights, meeting these requirements: - Lives within 1 hour of care - Has a competent adult at home to  recover with post-op recover - NO history of  - Chronic pain requiring opiods  - Diabetes  - Coronary Artery Disease  - Heart failure  - Heart attack  - Stroke  - DVT/VTE  - Cardiac arrhythmia  - Respiratory Failure/COPD  - Renal failure  - Anemia  - Advanced Liver disease    West Pugh. Rayon Mcchristian   PA-C  09/17/2019, 11:00 AM

## 2019-09-17 ENCOUNTER — Encounter (HOSPITAL_COMMUNITY)
Admission: RE | Admit: 2019-09-17 | Discharge: 2019-09-17 | Disposition: A | Discharge status: Home / Self Care | Payer: Medicare Other | Source: Ambulatory Visit | Attending: Orthopedic Surgery | Admitting: Orthopedic Surgery

## 2019-09-17 ENCOUNTER — Encounter (HOSPITAL_COMMUNITY): Payer: Self-pay

## 2019-09-17 ENCOUNTER — Other Ambulatory Visit: Payer: Self-pay

## 2019-09-17 DIAGNOSIS — Z01818 Encounter for other preprocedural examination: Secondary | ICD-10-CM | POA: Insufficient documentation

## 2019-09-17 HISTORY — DX: Unspecified osteoarthritis, unspecified site: M19.90

## 2019-09-17 HISTORY — DX: Cardiac murmur, unspecified: R01.1

## 2019-09-17 HISTORY — DX: Anxiety disorder, unspecified: F41.9

## 2019-09-17 HISTORY — DX: Essential (primary) hypertension: I10

## 2019-09-17 LAB — CBC
HCT: 39.1 % (ref 36.0–46.0)
Hemoglobin: 13.1 g/dL (ref 12.0–15.0)
MCH: 34 pg (ref 26.0–34.0)
MCHC: 33.5 g/dL (ref 30.0–36.0)
MCV: 101.6 fL — ABNORMAL HIGH (ref 80.0–100.0)
Platelets: 239 10*3/uL (ref 150–400)
RBC: 3.85 MIL/uL — ABNORMAL LOW (ref 3.87–5.11)
RDW: 12.2 % (ref 11.5–15.5)
WBC: 7.6 10*3/uL (ref 4.0–10.5)
nRBC: 0 % (ref 0.0–0.2)

## 2019-09-17 LAB — SURGICAL PCR SCREEN
MRSA, PCR: NEGATIVE
Staphylococcus aureus: NEGATIVE

## 2019-09-17 LAB — BASIC METABOLIC PANEL
Anion gap: 9 (ref 5–15)
BUN: 15 mg/dL (ref 8–23)
CO2: 26 mmol/L (ref 22–32)
Calcium: 9.9 mg/dL (ref 8.9–10.3)
Chloride: 106 mmol/L (ref 98–111)
Creatinine, Ser: 0.8 mg/dL (ref 0.44–1.00)
GFR calc Af Amer: >60 mL/min (ref >60)
GFR calc non Af Amer: >60 mL/min (ref >60)
Glucose, Bld: 95 mg/dL (ref 70–99)
Potassium: 3.9 mmol/L (ref 3.5–5.1)
Sodium: 141 mmol/L (ref 135–145)

## 2019-09-18 DIAGNOSIS — I10 Essential (primary) hypertension: Secondary | ICD-10-CM | POA: Diagnosis not present

## 2019-09-18 DIAGNOSIS — L309 Dermatitis, unspecified: Secondary | ICD-10-CM | POA: Diagnosis not present

## 2019-09-18 DIAGNOSIS — M1712 Unilateral primary osteoarthritis, left knee: Secondary | ICD-10-CM | POA: Diagnosis not present

## 2019-09-18 LAB — ABO/RH: ABO/RH(D): O POS

## 2019-09-23 ENCOUNTER — Other Ambulatory Visit (HOSPITAL_COMMUNITY)
Admission: RE | Admit: 2019-09-23 | Discharge: 2019-09-23 | Disposition: A | Discharge status: Home / Self Care | Payer: Medicare Other | Source: Ambulatory Visit | Attending: Orthopedic Surgery | Admitting: Orthopedic Surgery

## 2019-09-23 DIAGNOSIS — Z20822 Contact with and (suspected) exposure to covid-19: Secondary | ICD-10-CM | POA: Diagnosis not present

## 2019-09-23 DIAGNOSIS — Z01812 Encounter for preprocedural laboratory examination: Secondary | ICD-10-CM | POA: Diagnosis not present

## 2019-09-23 LAB — SARS CORONAVIRUS 2 (TAT 6-24 HRS): SARS Coronavirus 2: NEGATIVE

## 2019-09-25 MED ORDER — GENTAMICIN SULFATE 40 MG/ML IJ SOLN
5.0000 mg/kg | INTRAVENOUS | Status: AC
  Administered 2019-09-26: 310 mg via INTRAVENOUS
  Filled 2019-09-25: qty 7.75

## 2019-09-25 NOTE — Anesthesia Preprocedure Evaluation (Addendum)
Anesthesia Evaluation  Patient identified by MRN, date of birth, ID band Patient awake    Reviewed: Allergy & Precautions, NPO status , Patient's Chart, lab work & pertinent test results  History of Anesthesia Complications Negative for: history of anesthetic complications  Airway Mallampati: II  TM Distance: >3 FB Neck ROM: Full  Mouth opening: Limited Mouth Opening  Dental  (+) Missing,    Pulmonary neg pulmonary ROS,    Pulmonary exam normal        Cardiovascular hypertension, Pt. on medications Normal cardiovascular exam  TTE 2016: EF 55-60%, mild to moderate AR, mild MR    Neuro/Psych Anxiety negative neurological ROS     GI/Hepatic negative GI ROS, Neg liver ROS,   Endo/Other  negative endocrine ROS  Renal/GU negative Renal ROS  negative genitourinary   Musculoskeletal  (+) Arthritis ,   Abdominal   Peds  Hematology negative hematology ROS (+)   Anesthesia Other Findings Day of surgery medications reviewed with patient.  Reproductive/Obstetrics negative OB ROS                            Anesthesia Physical Anesthesia Plan  ASA: II  Anesthesia Plan: Spinal   Post-op Pain Management:  Regional for Post-op pain   Induction:   PONV Risk Score and Plan: 3 and Treatment may vary due to age or medical condition, Ondansetron, Propofol infusion and Dexamethasone  Airway Management Planned: Natural Airway and Simple Face Mask  Additional Equipment:   Intra-op Plan:   Post-operative Plan:   Informed Consent: I have reviewed the patients History and Physical, chart, labs and discussed the procedure including the risks, benefits and alternatives for the proposed anesthesia with the patient or authorized representative who has indicated his/her understanding and acceptance.       Plan Discussed with: CRNA  Anesthesia Plan Comments:        Anesthesia Quick Evaluation

## 2019-09-26 ENCOUNTER — Other Ambulatory Visit: Payer: Self-pay

## 2019-09-26 ENCOUNTER — Encounter (HOSPITAL_COMMUNITY): Payer: Self-pay | Admitting: Orthopedic Surgery

## 2019-09-26 ENCOUNTER — Encounter (HOSPITAL_COMMUNITY)
Admission: RE | Disposition: A | Discharge status: Home / Self Care | Payer: Self-pay | Source: Home / Self Care | Attending: Orthopedic Surgery

## 2019-09-26 ENCOUNTER — Ambulatory Visit (HOSPITAL_COMMUNITY): Payer: Medicare Other | Admitting: Anesthesiology

## 2019-09-26 ENCOUNTER — Observation Stay (HOSPITAL_COMMUNITY)
Admission: RE | Admit: 2019-09-26 | Discharge: 2019-09-27 | Disposition: A | Discharge status: Home / Self Care | Payer: Medicare Other | Attending: Orthopedic Surgery | Admitting: Orthopedic Surgery

## 2019-09-26 DIAGNOSIS — Z881 Allergy status to other antibiotic agents status: Secondary | ICD-10-CM | POA: Insufficient documentation

## 2019-09-26 DIAGNOSIS — Z96652 Presence of left artificial knee joint: Secondary | ICD-10-CM | POA: Diagnosis present

## 2019-09-26 DIAGNOSIS — Z7982 Long term (current) use of aspirin: Secondary | ICD-10-CM | POA: Insufficient documentation

## 2019-09-26 DIAGNOSIS — Z885 Allergy status to narcotic agent status: Secondary | ICD-10-CM | POA: Insufficient documentation

## 2019-09-26 DIAGNOSIS — Z880 Allergy status to penicillin: Secondary | ICD-10-CM | POA: Diagnosis not present

## 2019-09-26 DIAGNOSIS — M1712 Unilateral primary osteoarthritis, left knee: Secondary | ICD-10-CM | POA: Diagnosis not present

## 2019-09-26 DIAGNOSIS — Z791 Long term (current) use of non-steroidal anti-inflammatories (NSAID): Secondary | ICD-10-CM | POA: Insufficient documentation

## 2019-09-26 DIAGNOSIS — F419 Anxiety disorder, unspecified: Secondary | ICD-10-CM | POA: Diagnosis not present

## 2019-09-26 DIAGNOSIS — Z79899 Other long term (current) drug therapy: Secondary | ICD-10-CM | POA: Diagnosis not present

## 2019-09-26 DIAGNOSIS — I10 Essential (primary) hypertension: Secondary | ICD-10-CM | POA: Diagnosis not present

## 2019-09-26 DIAGNOSIS — G8918 Other acute postprocedural pain: Secondary | ICD-10-CM | POA: Diagnosis not present

## 2019-09-26 HISTORY — PX: TOTAL KNEE ARTHROPLASTY: SHX125

## 2019-09-26 LAB — TYPE AND SCREEN
ABO/RH(D): O POS
Antibody Screen: NEGATIVE

## 2019-09-26 SURGERY — ARTHROPLASTY, KNEE, TOTAL
Anesthesia: Spinal | Site: Knee | Laterality: Left

## 2019-09-26 MED ORDER — DEXAMETHASONE SODIUM PHOSPHATE 10 MG/ML IJ SOLN
10.0000 mg | Freq: Once | INTRAMUSCULAR | Status: AC
  Administered 2019-09-26: 5 mg via INTRAVENOUS

## 2019-09-26 MED ORDER — FENTANYL CITRATE (PF) 100 MCG/2ML IJ SOLN
INTRAMUSCULAR | Status: DC | PRN
  Administered 2019-09-26: 50 ug via INTRAVENOUS

## 2019-09-26 MED ORDER — PROMETHAZINE HCL 25 MG/ML IJ SOLN: 6.2500 mg | INTRAMUSCULAR | Status: DC | PRN

## 2019-09-26 MED ORDER — OXYCODONE HCL 5 MG/5ML PO SOLN: 5.0000 mg | Freq: Once | ORAL | Status: DC | PRN

## 2019-09-26 MED ORDER — DEXAMETHASONE SODIUM PHOSPHATE 10 MG/ML IJ SOLN
10.0000 mg | Freq: Once | INTRAMUSCULAR | Status: AC
  Administered 2019-09-27: 10 mg via INTRAVENOUS
  Filled 2019-09-26: qty 1

## 2019-09-26 MED ORDER — POLYETHYLENE GLYCOL 3350 17 G PO PACK
17.0000 g | PACK | Freq: Two times a day (BID) | ORAL | Status: DC
  Administered 2019-09-26 – 2019-09-27 (×2): 17 g via ORAL
  Filled 2019-09-26 (×2): qty 1

## 2019-09-26 MED ORDER — VANCOMYCIN HCL IN DEXTROSE 1-5 GM/200ML-% IV SOLN
1000.0000 mg | INTRAVENOUS | Status: AC
  Administered 2019-09-26: 1000 mg via INTRAVENOUS
  Filled 2019-09-26: qty 200

## 2019-09-26 MED ORDER — METOCLOPRAMIDE HCL 5 MG/ML IJ SOLN: 5.0000 mg | Freq: Three times a day (TID) | INTRAMUSCULAR | Status: DC | PRN

## 2019-09-26 MED ORDER — DOCUSATE SODIUM 100 MG PO CAPS
100.0000 mg | ORAL_CAPSULE | Freq: Two times a day (BID) | ORAL | 0 refills | Status: DC
Start: 2019-09-26 — End: 2019-09-26

## 2019-09-26 MED ORDER — DIPHENHYDRAMINE HCL 12.5 MG/5ML PO ELIX: 12.5000 mg | ORAL_SOLUTION | ORAL | Status: DC | PRN

## 2019-09-26 MED ORDER — TRANEXAMIC ACID-NACL 1000-0.7 MG/100ML-% IV SOLN: 1000.0000 mg | Freq: Once | INTRAVENOUS | Status: DC

## 2019-09-26 MED ORDER — FERROUS SULFATE 325 (65 FE) MG PO TABS: 325.0000 mg | ORAL_TABLET | Freq: Three times a day (TID) | ORAL | 0 refills | Status: DC

## 2019-09-26 MED ORDER — POLYETHYLENE GLYCOL 3350 17 G PO PACK
17.0000 g | PACK | Freq: Two times a day (BID) | ORAL | 0 refills | Status: DC
Start: 2019-09-26 — End: 2019-09-26

## 2019-09-26 MED ORDER — VANCOMYCIN HCL IN DEXTROSE 1-5 GM/200ML-% IV SOLN
1000.0000 mg | Freq: Two times a day (BID) | INTRAVENOUS | Status: AC
  Administered 2019-09-26: 1000 mg via INTRAVENOUS
  Filled 2019-09-26: qty 200

## 2019-09-26 MED ORDER — BISACODYL 10 MG RE SUPP: 10.0000 mg | Freq: Every day | RECTAL | Status: DC | PRN

## 2019-09-26 MED ORDER — ALPRAZOLAM 0.25 MG PO TABS
0.1250 mg | ORAL_TABLET | Freq: Every day | ORAL | Status: DC
  Administered 2019-09-26: 0.25 mg via ORAL
  Filled 2019-09-26: qty 1

## 2019-09-26 MED ORDER — ASPIRIN 81 MG PO CHEW
81.0000 mg | CHEWABLE_TABLET | Freq: Two times a day (BID) | ORAL | 0 refills | Status: DC
Start: 2019-09-27 — End: 2019-09-26

## 2019-09-26 MED ORDER — TRANEXAMIC ACID-NACL 1000-0.7 MG/100ML-% IV SOLN
1000.0000 mg | INTRAVENOUS | Status: DC
  Filled 2019-09-26: qty 100

## 2019-09-26 MED ORDER — HYDROCODONE-ACETAMINOPHEN 7.5-325 MG PO TABS: 1.0000 | ORAL_TABLET | ORAL | 0 refills | Status: DC | PRN

## 2019-09-26 MED ORDER — OXYCODONE HCL 5 MG PO TABS: 5.0000 mg | ORAL_TABLET | Freq: Once | ORAL | Status: DC | PRN

## 2019-09-26 MED ORDER — METHOCARBAMOL 500 MG PO TABS
500.0000 mg | ORAL_TABLET | Freq: Four times a day (QID) | ORAL | Status: DC | PRN
  Administered 2019-09-26 (×2): 500 mg via ORAL
  Filled 2019-09-26 (×2): qty 1

## 2019-09-26 MED ORDER — BUPIVACAINE IN DEXTROSE 0.75-8.25 % IT SOLN
INTRATHECAL | Status: DC | PRN
  Administered 2019-09-26: 1.6 mL via INTRATHECAL

## 2019-09-26 MED ORDER — ACETAMINOPHEN 325 MG PO TABS: 325.0000 mg | ORAL_TABLET | Freq: Four times a day (QID) | ORAL | Status: DC | PRN

## 2019-09-26 MED ORDER — BUPIVACAINE-EPINEPHRINE (PF) 0.5% -1:200000 IJ SOLN
INTRAMUSCULAR | Status: DC | PRN
Start: 2019-09-26 — End: 2019-09-26
  Administered 2019-09-26: 15 mL via PERINEURAL

## 2019-09-26 MED ORDER — DOCUSATE SODIUM 100 MG PO CAPS
100.0000 mg | ORAL_CAPSULE | Freq: Two times a day (BID) | ORAL | Status: DC
  Administered 2019-09-26 – 2019-09-27 (×2): 100 mg via ORAL
  Filled 2019-09-26 (×2): qty 1

## 2019-09-26 MED ORDER — 0.9 % SODIUM CHLORIDE (POUR BTL) OPTIME
TOPICAL | Status: DC | PRN
  Administered 2019-09-26: 1000 mL

## 2019-09-26 MED ORDER — FENTANYL CITRATE (PF) 100 MCG/2ML IJ SOLN
50.0000 ug | INTRAMUSCULAR | Status: DC
  Administered 2019-09-26: 50 ug via INTRAVENOUS

## 2019-09-26 MED ORDER — ACETAMINOPHEN 500 MG PO TABS
1000.0000 mg | ORAL_TABLET | Freq: Once | ORAL | Status: AC
  Administered 2019-09-26: 1000 mg via ORAL
  Filled 2019-09-26: qty 2

## 2019-09-26 MED ORDER — SODIUM CHLORIDE (PF) 0.9 % IJ SOLN
INTRAMUSCULAR | Status: AC
  Filled 2019-09-26: qty 50

## 2019-09-26 MED ORDER — SODIUM CHLORIDE (PF) 0.9 % IJ SOLN
INTRAMUSCULAR | Status: DC | PRN
  Administered 2019-09-26: 30 mL

## 2019-09-26 MED ORDER — ONDANSETRON HCL 4 MG/2ML IJ SOLN
INTRAMUSCULAR | Status: DC | PRN
  Administered 2019-09-26: 4 mg via INTRAVENOUS

## 2019-09-26 MED ORDER — ACETAMINOPHEN 10 MG/ML IV SOLN: 1000.0000 mg | Freq: Once | INTRAVENOUS | Status: DC | PRN

## 2019-09-26 MED ORDER — PHENYLEPHRINE HCL (PRESSORS) 10 MG/ML IV SOLN
INTRAVENOUS | Status: AC
  Filled 2019-09-26: qty 1

## 2019-09-26 MED ORDER — ONDANSETRON HCL 4 MG/2ML IJ SOLN
INTRAMUSCULAR | Status: AC
  Filled 2019-09-26: qty 2

## 2019-09-26 MED ORDER — FERROUS SULFATE 325 (65 FE) MG PO TABS
325.0000 mg | ORAL_TABLET | Freq: Two times a day (BID) | ORAL | Status: DC
  Administered 2019-09-26 – 2019-09-27 (×2): 325 mg via ORAL
  Filled 2019-09-26 (×2): qty 1

## 2019-09-26 MED ORDER — POLYETHYLENE GLYCOL 3350 17 G PO PACK
17.0000 g | PACK | Freq: Two times a day (BID) | ORAL | 0 refills | Status: DC
Start: 2019-09-26 — End: 2020-06-25

## 2019-09-26 MED ORDER — BUPIVACAINE-EPINEPHRINE (PF) 0.25% -1:200000 IJ SOLN
INTRAMUSCULAR | Status: AC
  Filled 2019-09-26: qty 30

## 2019-09-26 MED ORDER — METHOCARBAMOL 500 MG PO TABS: 500.0000 mg | ORAL_TABLET | Freq: Four times a day (QID) | ORAL | 0 refills | Status: DC | PRN

## 2019-09-26 MED ORDER — ROSUVASTATIN CALCIUM 20 MG PO TABS
20.0000 mg | ORAL_TABLET | ORAL | Status: DC
  Administered 2019-09-27: 20 mg via ORAL
  Filled 2019-09-26: qty 1

## 2019-09-26 MED ORDER — CELECOXIB 200 MG PO CAPS
200.0000 mg | ORAL_CAPSULE | Freq: Two times a day (BID) | ORAL | 0 refills | Status: DC
Start: 2019-09-26 — End: 2020-06-25

## 2019-09-26 MED ORDER — KETOROLAC TROMETHAMINE 30 MG/ML IJ SOLN
INTRAMUSCULAR | Status: AC
  Filled 2019-09-26: qty 1

## 2019-09-26 MED ORDER — PHENYLEPHRINE HCL-NACL 10-0.9 MG/250ML-% IV SOLN
INTRAVENOUS | Status: DC | PRN
  Administered 2019-09-26: 25 ug/min via INTRAVENOUS

## 2019-09-26 MED ORDER — PROPOFOL 500 MG/50ML IV EMUL
INTRAVENOUS | Status: DC | PRN
  Administered 2019-09-26: 75 ug/kg/min via INTRAVENOUS

## 2019-09-26 MED ORDER — METOCLOPRAMIDE HCL 5 MG PO TABS: 5.0000 mg | ORAL_TABLET | Freq: Three times a day (TID) | ORAL | Status: DC | PRN

## 2019-09-26 MED ORDER — KETOROLAC TROMETHAMINE 30 MG/ML IJ SOLN
INTRAMUSCULAR | Status: DC | PRN
  Administered 2019-09-26: 30 mg

## 2019-09-26 MED ORDER — POVIDONE-IODINE 10 % EX SWAB
2.0000 "application " | Freq: Once | CUTANEOUS | Status: AC
  Administered 2019-09-26: 2 via TOPICAL

## 2019-09-26 MED ORDER — HYDROCODONE-ACETAMINOPHEN 5-325 MG PO TABS
1.0000 | ORAL_TABLET | ORAL | Status: DC | PRN
  Administered 2019-09-26: 2 via ORAL
  Administered 2019-09-26: 1 via ORAL
  Filled 2019-09-26: qty 1
  Filled 2019-09-26: qty 2

## 2019-09-26 MED ORDER — STERILE WATER FOR IRRIGATION IR SOLN
Status: DC | PRN
  Administered 2019-09-26: 2000 mL

## 2019-09-26 MED ORDER — METHOCARBAMOL 500 MG IVPB - SIMPLE MED
500.0000 mg | Freq: Four times a day (QID) | INTRAVENOUS | Status: DC | PRN
  Filled 2019-09-26: qty 50

## 2019-09-26 MED ORDER — ONDANSETRON HCL 4 MG PO TABS: 4.0000 mg | ORAL_TABLET | Freq: Four times a day (QID) | ORAL | Status: DC | PRN

## 2019-09-26 MED ORDER — DEXAMETHASONE SODIUM PHOSPHATE 10 MG/ML IJ SOLN
INTRAMUSCULAR | Status: AC
  Filled 2019-09-26: qty 1

## 2019-09-26 MED ORDER — DOCUSATE SODIUM 100 MG PO CAPS
100.0000 mg | ORAL_CAPSULE | Freq: Two times a day (BID) | ORAL | 0 refills | Status: DC
Start: 2019-09-26 — End: 2020-06-25

## 2019-09-26 MED ORDER — MAGNESIUM CITRATE PO SOLN: 1.0000 | Freq: Once | ORAL | Status: DC | PRN

## 2019-09-26 MED ORDER — FENTANYL CITRATE (PF) 100 MCG/2ML IJ SOLN
INTRAMUSCULAR | Status: AC
  Filled 2019-09-26: qty 2

## 2019-09-26 MED ORDER — MIDAZOLAM HCL 2 MG/2ML IJ SOLN
INTRAMUSCULAR | Status: AC
  Filled 2019-09-26: qty 2

## 2019-09-26 MED ORDER — HYDROCHLOROTHIAZIDE 12.5 MG PO CAPS
12.5000 mg | ORAL_CAPSULE | Freq: Every day | ORAL | Status: DC
  Administered 2019-09-26: 12.5 mg via ORAL
  Filled 2019-09-26 (×2): qty 1

## 2019-09-26 MED ORDER — ONDANSETRON HCL 4 MG/2ML IJ SOLN: 4.0000 mg | Freq: Four times a day (QID) | INTRAMUSCULAR | Status: DC | PRN

## 2019-09-26 MED ORDER — ASPIRIN 81 MG PO CHEW
81.0000 mg | CHEWABLE_TABLET | Freq: Two times a day (BID) | ORAL | Status: DC
  Administered 2019-09-26 – 2019-09-27 (×2): 81 mg via ORAL
  Filled 2019-09-26 (×2): qty 1

## 2019-09-26 MED ORDER — LACTATED RINGERS IV BOLUS: 250.0000 mL | Freq: Once | INTRAVENOUS | Status: DC

## 2019-09-26 MED ORDER — LOSARTAN POTASSIUM-HCTZ 100-12.5 MG PO TABS: 1.0000 | ORAL_TABLET | Freq: Every day | ORAL | Status: DC

## 2019-09-26 MED ORDER — LACTATED RINGERS IV SOLN: INTRAVENOUS | Status: DC

## 2019-09-26 MED ORDER — CELECOXIB 200 MG PO CAPS
200.0000 mg | ORAL_CAPSULE | Freq: Two times a day (BID) | ORAL | Status: DC
  Administered 2019-09-26 – 2019-09-27 (×2): 200 mg via ORAL
  Filled 2019-09-26 (×2): qty 1

## 2019-09-26 MED ORDER — LOSARTAN POTASSIUM 50 MG PO TABS
100.0000 mg | ORAL_TABLET | Freq: Every day | ORAL | Status: DC
  Filled 2019-09-26 (×2): qty 2

## 2019-09-26 MED ORDER — VERAPAMIL HCL ER 240 MG PO TBCR
240.0000 mg | EXTENDED_RELEASE_TABLET | Freq: Every day | ORAL | Status: DC
  Administered 2019-09-26: 240 mg via ORAL
  Filled 2019-09-26 (×2): qty 1

## 2019-09-26 MED ORDER — LACTATED RINGERS IV BOLUS: 500.0000 mL | Freq: Once | INTRAVENOUS | Status: DC

## 2019-09-26 MED ORDER — SODIUM CHLORIDE 0.9 % IV SOLN: INTRAVENOUS | Status: DC

## 2019-09-26 MED ORDER — CELECOXIB 200 MG PO CAPS
200.0000 mg | ORAL_CAPSULE | Freq: Two times a day (BID) | ORAL | 0 refills | Status: DC
Start: 2019-09-26 — End: 2019-09-26

## 2019-09-26 MED ORDER — PHENOL 1.4 % MT LIQD: 1.0000 | OROMUCOSAL | Status: DC | PRN

## 2019-09-26 MED ORDER — PROPOFOL 1000 MG/100ML IV EMUL
INTRAVENOUS | Status: AC
  Filled 2019-09-26: qty 100

## 2019-09-26 MED ORDER — PROPOFOL 10 MG/ML IV BOLUS
INTRAVENOUS | Status: DC | PRN
  Administered 2019-09-26: 20 mg via INTRAVENOUS

## 2019-09-26 MED ORDER — MENTHOL 3 MG MT LOZG: 1.0000 | LOZENGE | OROMUCOSAL | Status: DC | PRN

## 2019-09-26 MED ORDER — BUPIVACAINE-EPINEPHRINE (PF) 0.25% -1:200000 IJ SOLN
INTRAMUSCULAR | Status: DC | PRN
  Administered 2019-09-26: 30 mL

## 2019-09-26 MED ORDER — ALUM & MAG HYDROXIDE-SIMETH 200-200-20 MG/5ML PO SUSP: 15.0000 mL | ORAL | Status: DC | PRN

## 2019-09-26 MED ORDER — FENTANYL CITRATE (PF) 100 MCG/2ML IJ SOLN: 25.0000 ug | INTRAMUSCULAR | Status: DC | PRN

## 2019-09-26 MED ORDER — ASPIRIN 81 MG PO CHEW: 81.0000 mg | CHEWABLE_TABLET | Freq: Two times a day (BID) | ORAL | 0 refills | Status: AC

## 2019-09-26 MED ORDER — MIDAZOLAM HCL 2 MG/2ML IJ SOLN
1.0000 mg | INTRAMUSCULAR | Status: DC
  Administered 2019-09-26: 1 mg via INTRAVENOUS

## 2019-09-26 MED ORDER — HYDROCODONE-ACETAMINOPHEN 7.5-325 MG PO TABS: 1.0000 | ORAL_TABLET | ORAL | Status: DC | PRN

## 2019-09-26 SURGICAL SUPPLY — 63 items
ADH SKN CLS APL DERMABOND .7 (GAUZE/BANDAGES/DRESSINGS) ×1
ATTUNE MED ANAT PAT 35 KNEE (Knees) ×1 IMPLANT
ATTUNE PS FEM LT SZ 4 CEM KNEE (Femur) ×1 IMPLANT
ATTUNE PSRP INSR SZ4 6 KNEE (Insert) ×1 IMPLANT
BAG SPEC THK2 15X12 ZIP CLS (MISCELLANEOUS)
BAG ZIPLOCK 12X15 (MISCELLANEOUS) IMPLANT
BASEPLATE TIBIAL ROTATING SZ 4 (Knees) ×1 IMPLANT
BLADE SAW SGTL 11.0X1.19X90.0M (BLADE) ×1 IMPLANT
BLADE SAW SGTL 13.0X1.19X90.0M (BLADE) ×2 IMPLANT
BLADE SURG SZ10 CARB STEEL (BLADE) ×4 IMPLANT
BNDG ELASTIC 6X5.8 VLCR STR LF (GAUZE/BANDAGES/DRESSINGS) ×2 IMPLANT
BOWL SMART MIX CTS (DISPOSABLE) ×2 IMPLANT
BSPLAT TIB 4 CMNT ROT PLAT STR (Knees) ×1 IMPLANT
CEMENT HV SMART SET (Cement) ×2 IMPLANT
COVER SURGICAL LIGHT HANDLE (MISCELLANEOUS) ×2 IMPLANT
COVER WAND RF STERILE (DRAPES) IMPLANT
CUFF TOURN SGL QUICK 34 (TOURNIQUET CUFF) ×2
CUFF TRNQT CYL 34X4.125X (TOURNIQUET CUFF) ×1 IMPLANT
DECANTER SPIKE VIAL GLASS SM (MISCELLANEOUS) ×4 IMPLANT
DERMABOND ADVANCED (GAUZE/BANDAGES/DRESSINGS) ×1
DERMABOND ADVANCED .7 DNX12 (GAUZE/BANDAGES/DRESSINGS) ×1 IMPLANT
DRAPE U-SHAPE 47X51 STRL (DRAPES) ×2 IMPLANT
DRESSING AQUACEL AG SP 3.5X10 (GAUZE/BANDAGES/DRESSINGS) ×1 IMPLANT
DRSG AQUACEL AG ADV 3.5X10 (GAUZE/BANDAGES/DRESSINGS) ×1 IMPLANT
DRSG AQUACEL AG SP 3.5X10 (GAUZE/BANDAGES/DRESSINGS) ×2
DURAPREP 26ML APPLICATOR (WOUND CARE) ×4 IMPLANT
ELECT REM PT RETURN 15FT ADLT (MISCELLANEOUS) ×2 IMPLANT
GLOVE BIO SURGEON STRL SZ 6 (GLOVE) ×1 IMPLANT
GLOVE BIOGEL PI IND STRL 6.5 (GLOVE) ×1 IMPLANT
GLOVE BIOGEL PI IND STRL 7.5 (GLOVE) ×1 IMPLANT
GLOVE BIOGEL PI IND STRL 8.5 (GLOVE) ×1 IMPLANT
GLOVE BIOGEL PI INDICATOR 6.5 (GLOVE)
GLOVE BIOGEL PI INDICATOR 7.5 (GLOVE) ×1
GLOVE BIOGEL PI INDICATOR 8.5 (GLOVE) ×1
GLOVE ECLIPSE 8.0 STRL XLNG CF (GLOVE) ×2 IMPLANT
GLOVE ORTHO TXT STRL SZ7.5 (GLOVE) ×2 IMPLANT
GOWN STRL REUS W/ TWL LRG LVL3 (GOWN DISPOSABLE) ×1 IMPLANT
GOWN STRL REUS W/TWL 2XL LVL3 (GOWN DISPOSABLE) ×2 IMPLANT
GOWN STRL REUS W/TWL LRG LVL3 (GOWN DISPOSABLE) ×4 IMPLANT
HANDPIECE INTERPULSE COAX TIP (DISPOSABLE) ×2
HOLDER FOLEY CATH W/STRAP (MISCELLANEOUS) IMPLANT
KIT TURNOVER KIT A (KITS) IMPLANT
MANIFOLD NEPTUNE II (INSTRUMENTS) ×2 IMPLANT
NDL SAFETY ECLIPSE 18X1.5 (NEEDLE) IMPLANT
NEEDLE HYPO 18GX1.5 SHARP (NEEDLE)
NS IRRIG 1000ML POUR BTL (IV SOLUTION) ×2 IMPLANT
PACK TOTAL KNEE CUSTOM (KITS) ×2 IMPLANT
PENCIL SMOKE EVACUATOR (MISCELLANEOUS) IMPLANT
PIN DRILL FIX HALF THREAD (BIT) ×1 IMPLANT
PIN FIX SIGMA LCS THRD HI (PIN) ×1 IMPLANT
PROTECTOR NERVE ULNAR (MISCELLANEOUS) ×2 IMPLANT
SET HNDPC FAN SPRY TIP SCT (DISPOSABLE) ×1 IMPLANT
SET PAD KNEE POSITIONER (MISCELLANEOUS) ×2 IMPLANT
SUT MNCRL AB 4-0 PS2 18 (SUTURE) ×2 IMPLANT
SUT STRATAFIX PDS+ 0 24IN (SUTURE) ×2 IMPLANT
SUT VIC AB 1 CT1 36 (SUTURE) ×2 IMPLANT
SUT VIC AB 2-0 CT1 27 (SUTURE) ×6
SUT VIC AB 2-0 CT1 TAPERPNT 27 (SUTURE) ×3 IMPLANT
SYR 3ML LL SCALE MARK (SYRINGE) ×2 IMPLANT
TRAY FOLEY MTR SLVR 16FR STAT (SET/KITS/TRAYS/PACK) ×2 IMPLANT
WATER STERILE IRR 1000ML POUR (IV SOLUTION) ×4 IMPLANT
WRAP KNEE MAXI GEL POST OP (GAUZE/BANDAGES/DRESSINGS) ×2 IMPLANT
YANKAUER SUCT BULB TIP 10FT TU (MISCELLANEOUS) ×2 IMPLANT

## 2019-09-26 NOTE — Discharge Instructions (Signed)

## 2019-09-26 NOTE — Transfer of Care (Signed)
Immediate Anesthesia Transfer of Care Note  Patient: Shannon [name]  Procedure(s) Performed: TOTAL KNEE ARTHROPLASTY (Left Knee)  Patient Location: PACU  Anesthesia Type:Regional and Spinal  Level of Consciousness: awake, alert  and oriented  Airway & Oxygen Therapy: Patient connected to face mask oxygen  Post-op Assessment: Report given to RN and Post -op Vital signs reviewed and stable  Post vital signs: Reviewed and stable  Last Vitals:  Vitals Value Taken Time  BP    Temp    Pulse 75 09/26/19 1032  Resp 13 09/26/19 1032  SpO2 100 % 09/26/19 1032  Vitals shown include unvalidated device data.  Last Pain:  Vitals:   09/26/19 0711  TempSrc:   PainSc: 0-No pain         Complications: No apparent anesthesia complications

## 2019-09-26 NOTE — Anesthesia Postprocedure Evaluation (Signed)
Anesthesia Post Note  Patient: Shannon [name]  Procedure(s) Performed: TOTAL KNEE ARTHROPLASTY (Left Knee)     Patient location during evaluation: PACU Anesthesia Type: Spinal Level of consciousness: awake and alert and oriented Pain management: pain level controlled Vital Signs Assessment: post-procedure vital signs reviewed and stable Respiratory status: spontaneous breathing, nonlabored ventilation and respiratory function stable Cardiovascular status: blood pressure returned to baseline Postop Assessment: no apparent nausea or vomiting, spinal receding, no headache and no backache Anesthetic complications: no    Last Vitals:  Vitals:   09/26/19 1145 09/26/19 1400  BP: (!) 115/47 (!) 127/56  Pulse: 61 (!) 56  Resp: 11 (!) 9  Temp:    SpO2: 100% 100%    Last Pain:  Vitals:   09/26/19 1400  TempSrc:   PainSc: 0-No pain                 Brennan Bailey

## 2019-09-26 NOTE — Anesthesia Procedure Notes (Signed)
Procedure Name: MAC Date/Time: 09/26/2019 8:40 AM Performed by: Maxwell Caul, CRNA Pre-anesthesia Checklist: Patient identified, Suction available, Emergency Drugs available and Patient being monitored Oxygen Delivery Method: Simple face mask

## 2019-09-26 NOTE — Anesthesia Procedure Notes (Addendum)
Anesthesia Regional Block: Adductor canal block   Pre-Anesthetic Checklist: ,, timeout performed, Correct Patient, Correct Site, Correct Laterality, Correct Procedure, Correct Position, site marked, Risks and benefits discussed, pre-op evaluation,  At surgeon's request and post-op pain management  Laterality: Left  Prep: Maximum Sterile Barrier Precautions used, chloraprep       Needles:  Injection technique: Single-shot  Needle Type: Echogenic Stimulator Needle     Needle Length: 9cm  Needle Gauge: 22     Additional Needles:   Procedures:,,,, ultrasound used (permanent image in chart),,,,  Narrative:  Start time: 09/26/2019 8:30 AM End time: 09/26/2019 8:33 AM Injection made incrementally with aspirations every 5 mL.  Performed by: Personally  Anesthesiologist: Brennan Bailey, MD  Additional Notes: Risks, benefits, and alternative discussed. Patient gave consent for procedure. Patient prepped and draped in sterile fashion. Sedation administered, patient remains easily responsive to voice. Relevant anatomy identified with ultrasound guidance. Local anesthetic given in 5cc increments with no signs or symptoms of intravascular injection. No pain or paraesthesias with injection. Patient monitored throughout procedure with signs of LAST or immediate complications. Tolerated well. Ultrasound image placed in chart.  Tawny Asal, MD

## 2019-09-26 NOTE — Anesthesia Procedure Notes (Signed)
Spinal  Patient location during procedure: OR End time: 09/26/2019 8:48 AM Staffing Performed: resident/CRNA  Anesthesiologist: Brennan Bailey, MD Resident/CRNA: Maxwell Caul, CRNA Preanesthetic Checklist Completed: patient identified, IV checked, site marked, risks and benefits discussed, surgical consent, monitors and equipment checked, pre-op evaluation and timeout performed Spinal Block Patient position: sitting Prep: DuraPrep Patient monitoring: heart rate, cardiac monitor, continuous pulse ox and blood pressure Approach: midline Location: L3-4 Injection technique: single-shot Needle Needle type: Sprotte  Needle gauge: 24 G Needle length: 10 cm Assessment Sensory level: T4 Additional Notes IV functioning, monitors applied to pt. Expiration date of kit checked and confirmed to be in date. Sterile prep and drape, hand hygiene and sterile gloved used. Pt was positioned and spine was prepped in sterile fashion. Skin was anesthetized with lidocaine. Free flow of clear CSF obtained prior to injecting local anesthetic into CSF x 1 attempt. Spinal needle aspirated freely following injection. Needle was carefully withdrawn, and pt tolerated procedure well. Loss of motor and sensory on exam post injection. Dr Daiva Huge at bedside for timeout and entire placement.

## 2019-09-26 NOTE — Care Plan (Signed)
Ortho Bundle Case Management Note  Patient Details  Name: Shannon [name] MRN: [id number] Date of Birth: August 02, 1945  L TKA on 09-26-19 DCP:  Home with spouse. 1 story home with ramp entrance. DME:  RW ordered through [place].  Has elevated toilets and doesn't want a 3-in-1. PT:  [facility].  PT eval scheduled on 10-01-19.                   DME Arranged:  Gilford Rile rolling DME Agency:  [employer]  HH Arranged:  NA Nelson Agency:  NA  Additional Comments: Please contact me with any questions of if this plan should need to change.  Marianne Sofia, RN,CCM [facility]  [phone] 09/26/2019, 8:55 AM

## 2019-09-26 NOTE — Interval H&P Note (Signed)
History and Physical Interval Note:  09/26/2019 7:19 AM  Shannon [name]  has presented today for surgery, with the diagnosis of Left knee osteoarthtitis.  The various methods of treatment have been discussed with the patient and family. After consideration of risks, benefits and other options for treatment, the patient has consented to  Procedure(s) with comments: TOTAL KNEE ARTHROPLASTY (Left) - 70 mins as a surgical intervention.  The patient's history has been reviewed, patient examined, no change in status, stable for surgery.  I have reviewed the patient's chart and labs.  Questions were answered to the patient's satisfaction.     Mauri Pole

## 2019-09-26 NOTE — Op Note (Signed)
NAME:  Shannon [name]                      MEDICAL RECORD NO.:  [id number]                             FACILITY:  [facility]      PHYSICIAN:  Pietro Cassis. Alvan Dame, M.D.  DATE OF BIRTH:  12/05/1945      DATE OF PROCEDURE:  09/26/2019                                     OPERATIVE REPORT         PREOPERATIVE DIAGNOSIS:  Left knee osteoarthritis.      POSTOPERATIVE DIAGNOSIS:  Left knee osteoarthritis.      FINDINGS:  The patient was noted to have complete loss of cartilage and   bone-on-bone arthritis with associated osteophytes in the medial and patellofemoral compartments of   the knee with a significant synovitis and associated effusion.  The patient had failed months of conservative treatment including medications, injection therapy, activity modification.     PROCEDURE:  Left total knee replacement.      COMPONENTS USED:  DePuy Attune rotating platform posterior stabilized knee   system, a size 4 femur, 4 tibia, size 6 mm PS AOX insert, and 35 anatomic patellar   button.      SURGEON:  Pietro Cassis. Alvan Dame, M.D.      ASSISTANT:  Danae Orleans, PA-C.      ANESTHESIA:  Regional and Spinal.      SPECIMENS:  None.      COMPLICATION:  None.      DRAINS:  None.  EBL: <100 cc      TOURNIQUET TIME:   Total Tourniquet Time Documented: Thigh (Left) - 28 minutes Total: Thigh (Left) - 28 minutes       The patient was stable to the recovery room.      INDICATION FOR PROCEDURE:  Shannon [name] is a 74 y.o. female patient of   mine.  The patient had been seen, evaluated, and treated for months conservatively in the   office with medication, activity modification, and injections.  The patient had   radiographic changes of bone-on-bone arthritis with endplate sclerosis and osteophytes noted.  Based on the radiographic changes and failed conservative measures, the patient   decided to proceed with definitive treatment, total knee replacement.  Risks of infection, DVT, component failure, need  for revision surgery, neurovascular injury were reviewed in the office setting.  The postop course was reviewed stressing the efforts to maximize post-operative satisfaction and function.  Consent was obtained for benefit of pain   relief.      PROCEDURE IN DETAIL:  The patient was brought to the operative theater.   Once adequate anesthesia, preoperative antibiotics, 1 gm of Vanomycin, weight based single dose of Gentamycin, 1 gm of Tranexamic Acid, and 10 mg of Decadron administered, the patient was positioned supine with a left thigh tourniquet placed.  The  left lower extremity was prepped and draped in sterile fashion.  A time-   out was performed identifying the patient, planned procedure, and the appropriate extremity.      The left lower extremity was placed in the [facility] leg holder.  The leg was   exsanguinated, tourniquet elevated to 250 mmHg.  A midline incision was   made followed by median parapatellar arthrotomy.  Following initial   exposure, attention was first directed to the patella.  Precut   measurement was noted to be 23 mm.  I resected down to 13 mm and used a   35 anatomic patellar button to restore patellar height as well as cover the cut surface.      The lug holes were drilled and a metal shim was placed to protect the   patella from retractors and saw blade during the procedure.      At this point, attention was now directed to the femur.  The femoral   canal was opened with a drill, irrigated to try to prevent fat emboli.  An   intramedullary rod was passed at 3 degrees valgus, 9 mm of bone was   resected off the distal femur.  Following this resection, the tibia was   subluxated anteriorly.  Using the extramedullary guide, 2 mm of bone was resected off   the proximal medial tibia.  We confirmed the gap would be   stable medially and laterally with a size 5 spacer block as well as confirmed that the tibial cut was perpendicular in the coronal plane, checking with  an alignment rod.      Once this was done, I sized the femur to be a size 4 in the anterior-   posterior dimension, chose a standard component based on medial and   lateral dimension.  The size 4 rotation block was then pinned in   position anterior referenced using the C-clamp to set rotation.  The   anterior, posterior, and  chamfer cuts were made without difficulty nor   notching making certain that I was along the anterior cortex to help   with flexion gap stability.      The final box cut was made off the lateral aspect of distal femur.      At this point, the tibia was sized to be a size 4.  The size 4 tray was   then pinned in position through the medial third of the tubercle,   drilled, and keel punched.  Trial reduction was now carried with a 4 femur,  4 tibia, a size 6 mm PS insert, and the 35 anatomic patella botton.  The knee was brought to full extension with good flexion stability with the patella   tracking through the trochlea without application of pressure.  Given   all these findings the trial components removed.  Final components were   opened and cement was mixed.  The knee was irrigated with normal saline solution and pulse lavage.  The synovial lining was   then injected with 30 cc of 0.25% Marcaine with epinephrine, 1 cc of Toradol and 30 cc of NS for a total of 61 cc.     Final implants were then cemented onto cleaned and dried cut surfaces of bone with the knee brought to extension with a size 6 mm PS trial insert.      Once the cement had fully cured, excess cement was removed   throughout the knee.  I confirmed that I was satisfied with the range of   motion and stability, and the final size 6 mm PS AOX insert was chosen.  It was   placed into the knee.      The tourniquet had been let down at 28 minutes.  No significant   hemostasis was required.  The extensor mechanism  was then reapproximated using #1 Vicryl and #1 Stratafix sutures with the knee   in  flexion.  The   remaining wound was closed with 2-0 Vicryl and running 4-0 Monocryl.   The knee was cleaned, dried, dressed sterilely using Dermabond and   Aquacel dressing.  The patient was then   brought to recovery room in stable condition, tolerating the procedure   well.   Please note that Physician Assistant, Danae Orleans, PA-C was present for the entirety of the case, and was utilized for pre-operative positioning, peri-operative retractor management, general facilitation of the procedure and for primary wound closure at the end of the case.              Pietro Cassis Alvan Dame, M.D.    09/26/2019 10:09 AM

## 2019-09-26 NOTE — Evaluation (Signed)
Physical Therapy Evaluation Patient Details Name: Shannon [name] MRN: [id number] DOB: 12/13/1945 Today's Date: 09/26/2019   History of Present Illness  Patient is 74 y.o. female s/p Lt TKA on 09/26/19 with PMH significant for HTN, OA, anxiety, back surgery.  Clinical Impression  Shannon [name] is a 74 y.o. female POD 0 s/p Lt TKA. Patient reports independence with mobility at baseline. Patient is now limited by functional impairments (see PT problem list below) and requires min assist for transfers and gait with RW. Patient was able to ambulate ~50 feet with RW and min assist. Patient instructed in exercise to facilitate ROM and circulation. Patient will benefit from continued skilled PT interventions to address impairments and progress towards PLOF. Acute PT will follow to progress mobility and stair training in preparation for safe discharge home.     Follow Up Recommendations Follow surgeon's recommendation for DC plan and follow-up therapies;Outpatient PT    Equipment Recommendations  Rolling walker with 5" wheels    Recommendations for Other Services       Precautions / Restrictions Precautions Precautions: [name] Restrictions Weight Bearing Restrictions: No LLE Weight Bearing: Weight bearing as tolerated      Mobility  Bed Mobility Overal bed mobility: Needs Assistance Bed Mobility: Supine to Sit     Supine to sit: Min assist;HOB elevated     General bed mobility comments: cues for sequencing, assist to pivot and bring Lt LE off EOB.  Transfers Overall transfer level: Needs assistance Equipment used: Rolling walker (2 wheeled) Transfers: Sit to/from Stand Sit to Stand: Min assist         General transfer comment: cues for technique with hand placement, assist to power up and steady with rising.   Ambulation/Gait Ambulation/Gait assistance: Min assist Gait Distance (Feet): 50 Feet Assistive device: Rolling walker (2 wheeled) Gait Pattern/deviations: Step-to  pattern;Decreased stance time - left;Decreased stride length Gait velocity: decreased   General Gait Details: cues for safe step pattern and proximity to RW, no overt LOB noted and no uckling at Lt knee.  Stairs       Wheelchair Mobility    Modified Rankin (Stroke Patients Only)       Balance Overall balance assessment: Needs assistance Sitting-balance support: Feet supported Sitting balance-Leahy Scale: Good     Standing balance support: During functional activity;Bilateral upper extremity supported Standing balance-Leahy Scale: Fair              Pertinent Vitals/Pain Pain Assessment: 0-10 Pain Score: 5  Pain Location: Lt Knee Pain Descriptors / Indicators: Aching Pain Intervention(s): Limited activity within patient's tolerance;Monitored during session;Repositioned;Ice applied    Home Living Family/patient expects to be discharged to:: Private residence Living Arrangements: Spouse/significant other Available Help at Discharge: Family Type of Home: House Home Access: Ramped entrance     [place]: [place]: Grab bars - tub/shower Additional Comments: son and daughter will be there to help    Prior Function Level of Independence: Independent               Hand Dominance   Dominant Hand: Right    Extremity/Trunk Assessment   Upper Extremity Assessment Upper Extremity Assessment: Overall WFL for tasks assessed    Lower Extremity Assessment Lower Extremity Assessment: LLE deficits/detail LLE Deficits / Details: good quad activation, no extensor lag with SLR LLE Sensation: WNL LLE Coordination: WNL    Cervical / Trunk Assessment Cervical / Trunk Assessment: Normal  Communication   Communication: No difficulties  Cognition Arousal/Alertness:  Awake/alert Behavior During Therapy: WFL for tasks assessed/performed Overall Cognitive Status: Within Functional Limits for tasks assessed      General Comments      Exercises  Total Joint Exercises Ankle Circles/Pumps: AROM;Both;20 reps;Seated Quad Sets: AROM;10 reps;Left;Seated Heel Slides: AROM;Right;10 reps;Seated   Assessment/Plan    PT Assessment Patient needs continued PT services  PT Problem List Decreased strength;Decreased range of motion;Decreased activity tolerance;Decreased balance;Decreased mobility;Decreased knowledge of use of DME       PT Treatment Interventions DME instruction;Gait training;Stair training;Functional mobility training;Therapeutic activities;Therapeutic exercise;Balance training;Patient/family education    PT Goals (Current goals can be found in the Care Plan section)  Acute Rehab PT Goals Patient Stated Goal: to recover and then decide if she needs to have Rt knee done PT Goal Formulation: With patient Time For Goal Achievement: 10/03/19 Potential to Achieve Goals: Good    Frequency 7X/week    AM-PAC PT "6 Clicks" Mobility  Outcome Measure Help needed turning from your back to your side while in a flat bed without using bedrails?: A Little Help needed moving from lying on your back to sitting on the side of a flat bed without using bedrails?: A Little Help needed moving to and from a bed to a chair (including a wheelchair)?: A Little Help needed standing up from a chair using your arms (e.g., wheelchair or bedside chair)?: A Little Help needed to walk in hospital room?: A Little Help needed climbing 3-5 steps with a railing? : A Little 6 Click Score: 18    End of Session Equipment Utilized During Treatment: Gait belt Activity Tolerance: Patient tolerated treatment well Patient left: in chair;with call bell/phone within reach;with chair alarm set;with family/visitor present Nurse Communication: Mobility status PT Visit Diagnosis: Muscle weakness (generalized) (M62.81);Difficulty in walking, not elsewhere classified (R26.2)    Time: [id number] PT Time Calculation (min) (ACUTE ONLY): 26 min   Charges:   PT  Evaluation $PT Eval Low Complexity: 1 Low PT Treatments $Gait Training: 8-22 mins        Verner Mould, DPT Physical Therapist with [facility] [phone]  09/26/2019 5:29 PM

## 2019-09-27 ENCOUNTER — Encounter: Payer: Self-pay | Admitting: *Deleted

## 2019-09-27 DIAGNOSIS — M1712 Unilateral primary osteoarthritis, left knee: Secondary | ICD-10-CM | POA: Diagnosis not present

## 2019-09-27 DIAGNOSIS — Z885 Allergy status to narcotic agent status: Secondary | ICD-10-CM | POA: Diagnosis not present

## 2019-09-27 DIAGNOSIS — Z881 Allergy status to other antibiotic agents status: Secondary | ICD-10-CM | POA: Diagnosis not present

## 2019-09-27 DIAGNOSIS — F419 Anxiety disorder, unspecified: Secondary | ICD-10-CM | POA: Diagnosis not present

## 2019-09-27 DIAGNOSIS — Z79899 Other long term (current) drug therapy: Secondary | ICD-10-CM | POA: Diagnosis not present

## 2019-09-27 DIAGNOSIS — I10 Essential (primary) hypertension: Secondary | ICD-10-CM | POA: Diagnosis not present

## 2019-09-27 LAB — CBC
HCT: 30 % — ABNORMAL LOW (ref 36.0–46.0)
Hemoglobin: 9.8 g/dL — ABNORMAL LOW (ref 12.0–15.0)
MCH: 33.7 pg (ref 26.0–34.0)
MCHC: 32.7 g/dL (ref 30.0–36.0)
MCV: 103.1 fL — ABNORMAL HIGH (ref 80.0–100.0)
Platelets: 181 10*3/uL (ref 150–400)
RBC: 2.91 MIL/uL — ABNORMAL LOW (ref 3.87–5.11)
RDW: 12.2 % (ref 11.5–15.5)
WBC: 10.5 10*3/uL (ref 4.0–10.5)
nRBC: 0 % (ref 0.0–0.2)

## 2019-09-27 LAB — BASIC METABOLIC PANEL
Anion gap: 9 (ref 5–15)
BUN: 16 mg/dL (ref 8–23)
CO2: 24 mmol/L (ref 22–32)
Calcium: 8.4 mg/dL — ABNORMAL LOW (ref 8.9–10.3)
Chloride: 104 mmol/L (ref 98–111)
Creatinine, Ser: 0.86 mg/dL (ref 0.44–1.00)
GFR calc Af Amer: >60 mL/min (ref >60)
GFR calc non Af Amer: >60 mL/min (ref >60)
Glucose, Bld: 133 mg/dL — ABNORMAL HIGH (ref 70–99)
Potassium: 4.2 mmol/L (ref 3.5–5.1)
Sodium: 137 mmol/L (ref 135–145)

## 2019-09-27 NOTE — Discharge Summary (Signed)
Patient ID: Shannon [name] MRN: [id number] DOB/AGE: 73-27-1947 73 y.o.  Admit date: 09/26/2019 Discharge date: 09/27/2019  Admission Diagnoses:  Left knee OA / pain   Discharge Diagnoses:  Same  Past Medical History:  Diagnosis Date  . Anxiety   . Arthritis   . Heart murmur   . Hypertension     Surgeries: Procedure(s): LEFT TOTAL KNEE ARTHROPLASTY on 09/26/2019   Consultants:   Discharged Condition: Improved  Hospital Course: Shannon [name] is an 74 y.o. female who was admitted 09/26/2019 for operative treatment of left knee OA / pain. Patient has severe unremitting pain that affects sleep, daily activities, and work/hobbies. After pre-op clearance the patient was taken to the operating room on 09/26/2019 and underwent  Procedure(s): LEFT TOTAL KNEE ARTHROPLASTY.    Patient was given perioperative antibiotics:  Anti-infectives (From admission, onward)   Start     Dose/Rate Route Frequency Ordered Stop   09/26/19 2000  vancomycin (VANCOCIN) IVPB 1000 mg/200 mL premix     1,000 mg 200 mL/hr over 60 Minutes Intravenous Every 12 hours 09/26/19 1504 09/26/19 2038   09/26/19 0700  vancomycin (VANCOCIN) IVPB 1000 mg/200 mL premix     1,000 mg 200 mL/hr over 60 Minutes Intravenous On call to O.R. 09/26/19 [id number] 09/26/19 0835   09/26/19 0600  gentamicin (GARAMYCIN) 310 mg in dextrose 5 % 100 mL IVPB     5 mg/kg  61.6 kg (Adjusted) 107.8 mL/hr over 60 Minutes Intravenous On call to O.R. 09/25/19 [id number] 09/26/19 0919       Patient was given sequential compression devices, early ambulation, and chemoprophylaxis to prevent DVT.  Patient benefited maximally from hospital stay and there were no complications.    Recent vital signs:  Patient Vitals for the past 24 hrs:  BP Temp Temp src Pulse Resp SpO2  09/27/19 0913 (!) 109/49 98.1 F (36.7 C) Oral 67 20 100 %  09/27/19 0508 (!) 106/54 97.7 F (36.5 C) Oral 70 17 99 %  09/26/19 2143 -- -- -- 84 16 97 %  09/26/19 2122 (!) 112/54  97.9 F (36.6 C) Oral (!) 52 16 98 %     Recent laboratory studies:  Recent Labs    09/27/19 0316  WBC 10.5  HGB 9.8*  HCT 30.0*  PLT 181  NA 137  K 4.2  CL 104  CO2 24  BUN 16  CREATININE 0.86  GLUCOSE 133*  CALCIUM 8.4*     Discharge Medications:   Allergies as of 09/27/2019      Reactions   Cephalexin Hives   Morphine Other (See Comments), Palpitations   Penicillins Hives      Medication List    STOP taking these medications   ibuprofen 200 MG tablet Commonly known as: ADVIL     TAKE these medications   ALPRAZolam 0.25 MG tablet Commonly known as: XANAX Take 0.125-0.25 mg by mouth at bedtime.   aspirin 81 MG chewable tablet Commonly known as: Aspirin Childrens Chew 1 tablet (81 mg total) by mouth 2 (two) times daily. Take for 4 weeks, then resume regular dose.   celecoxib 200 MG capsule Commonly known as: CeleBREX Take 1 capsule (200 mg total) by mouth 2 (two) times daily.   docusate sodium 100 MG capsule Commonly known as: Colace Take 1 capsule (100 mg total) by mouth 2 (two) times daily.   ferrous sulfate 325 (65 FE) MG tablet Commonly known as: FerrouSul Take 1 tablet (325 mg total) by mouth 3 (three)  times daily with meals for 14 days.   HYDROcodone-acetaminophen 7.5-325 MG tablet Commonly known as: Norco Take 1-2 tablets by mouth every 4 (four) hours as needed for moderate pain.   losartan-hydrochlorothiazide 100-12.5 MG tablet Commonly known as: HYZAAR Take 1 tablet by mouth daily. Patient states she takes 1/2 tablet now   methocarbamol 500 MG tablet Commonly known as: Robaxin Take 1 tablet (500 mg total) by mouth every 6 (six) hours as needed for muscle spasms.   polyethylene glycol 17 g packet Commonly known as: MIRALAX / GLYCOLAX Take 17 g by mouth 2 (two) times daily.   rosuvastatin 20 MG tablet Commonly known as: CRESTOR Take 20 mg by mouth every other day.   verapamil 240 MG CR tablet Commonly known as: CALAN-SR Take 240  mg by mouth daily.   vitamin B-12 1000 MCG tablet Commonly known as: CYANOCOBALAMIN Take 1,000 mcg by mouth daily.   Vitamin D3 50 MCG (2000 UT) Tabs Take 2,000 mg by mouth daily.            Discharge Care Instructions  (From admission, onward)         Start     Ordered   09/27/19 0000  Change dressing    Comments: Maintain surgical dressing until follow up in the clinic. If the edges start to pull up, may reinforce with tape. If the dressing is no longer working, may remove and cover with gauze and tape, but must keep the area dry and clean.  Call with any questions or concerns.   09/27/19 0734   09/26/19 0000  Change dressing    Comments: Maintain surgical dressing until follow up in the clinic. If the edges start to pull up, may reinforce with tape. If the dressing is no longer working, may remove and cover with gauze and tape, but must keep the area dry and clean.  Call with any questions or concerns.   09/26/19 0808          Diagnostic Studies: No results found.  Disposition: Discharge disposition: 01-Home or Self Care       Discharge Instructions    Call MD / Call 911   Complete by: As directed    If you experience chest pain or shortness of breath, CALL 911 and be transported to the hospital emergency room.  If you develope a fever above 101 F, pus (white drainage) or increased drainage or redness at the wound, or calf pain, call your surgeon's office.   Call MD / Call 911   Complete by: As directed    If you experience chest pain or shortness of breath, CALL 911 and be transported to the hospital emergency room.  If you develope a fever above 101 F, pus (white drainage) or increased drainage or redness at the wound, or calf pain, call your surgeon's office.   Change dressing   Complete by: As directed    Maintain surgical dressing until follow up in the clinic. If the edges start to pull up, may reinforce with tape. If the dressing is no longer working, may  remove and cover with gauze and tape, but must keep the area dry and clean.  Call with any questions or concerns.   Change dressing   Complete by: As directed    Maintain surgical dressing until follow up in the clinic. If the edges start to pull up, may reinforce with tape. If the dressing is no longer working, may remove and cover with gauze and tape,  but must keep the area dry and clean.  Call with any questions or concerns.   Constipation Prevention   Complete by: As directed    Drink plenty of fluids.  Prune juice may be helpful.  You may use a stool softener, such as Colace (over the counter) 100 mg twice a day.  Use MiraLax (over the counter) for constipation as needed.   Constipation Prevention   Complete by: As directed    Drink plenty of fluids.  Prune juice may be helpful.  You may use a stool softener, such as Colace (over the counter) 100 mg twice a day.  Use MiraLax (over the counter) for constipation as needed.   Diet - low sodium heart healthy   Complete by: As directed    Diet - low sodium heart healthy   Complete by: As directed    Discharge instructions   Complete by: As directed    Maintain surgical dressing until follow up in the clinic. If the edges start to pull up, may reinforce with tape. If the dressing is no longer working, may remove and cover with gauze and tape, but must keep the area dry and clean.  Follow up in 2 weeks at [facility]. Call with any questions or concerns.   Discharge instructions   Complete by: As directed    Maintain surgical dressing until follow up in the clinic. If the edges start to pull up, may reinforce with tape. If the dressing is no longer working, may remove and cover with gauze and tape, but must keep the area dry and clean.  Follow up in 2 weeks at [facility]. Call with any questions or concerns.   Increase activity slowly as tolerated   Complete by: As directed    Weight bearing as tolerated with assist device (walker, cane, etc) as  directed, use it as long as suggested by your surgeon or therapist, typically at least 4-6 weeks.   Increase activity slowly as tolerated   Complete by: As directed    Weight bearing as tolerated with assist device (walker, cane, etc) as directed, use it as long as suggested by your surgeon or therapist, typically at least 4-6 weeks.   TED hose   Complete by: As directed    Use stockings (TED hose) for 2 weeks on both leg(s).  You may remove them at night for sleeping.   TED hose   Complete by: As directed    Use stockings (TED hose) for 2 weeks on both leg(s).  You may remove them at night for sleeping.      Follow-up Information    [facility] Go on 10/01/2019.   Why: You are scheduled for a physical therapy appointment on 10-01-19 at 10:45 am.  Contact information: [place] [address] [id number]        Danae Orleans, PA-C. Go on 10/10/2019.   Specialty: Orthopedic Surgery Why: You are scheduled for a post-operative appointment on 10-10-19 at 2:00 pm.  Contact information: [address] [place] [address] [id number]            Signed: Lucille Passy [facility] 09/27/2019, 8:44 PM

## 2019-09-27 NOTE — Progress Notes (Addendum)
   Subjective: 1 Day Post-Op Procedure(s) (LRB): TOTAL KNEE ARTHROPLASTY (Left) Patient reports pain as mild.   Patient seen in rounds for Dr. Alvan Dame. Patient is well, and has had no acute complaints or problems other than discomfort in the left knee. No acute events overnight. Foley catheter removed, positive flatus. Denies CP, SHOB, N/V. Ambulated 50 feet with PT yesterday.  We will continue therapy today.   Objective: Vital signs in last 24 hours: Temp:  [97.7 F (36.5 C)-98.4 F (36.9 C)] 97.7 F (36.5 C) (05/28 0508) Pulse Rate:  [52-87] 70 (05/28 0508) Resp:  [9-18] 17 (05/28 0508) BP: (95-133)/(47-72) 106/54 (05/28 0508) SpO2:  [97 %-100 %] 99 % (05/28 0508)  Intake/Output from previous day:  Intake/Output Summary (Last 24 hours) at 09/27/2019 0730 Last data filed at 09/27/2019 [id number] Gross per 24 hour  Intake 3343.15 ml  Output 1750 ml  Net 1593.15 ml     Intake/Output this shift: No intake/output data recorded.  Labs: Recent Labs    09/27/19 0316  HGB 9.8*   Recent Labs    09/27/19 0316  WBC 10.5  RBC 2.91*  HCT 30.0*  PLT 181   Recent Labs    09/27/19 0316  NA 137  K 4.2  CL 104  CO2 24  BUN 16  CREATININE 0.86  GLUCOSE 133*  CALCIUM 8.4*   No results for input(s): LABPT, INR in the last 72 hours.  Exam: General - Patient is Alert and Oriented Extremity - Neurologically intact Sensation intact distally Intact pulses distally Dorsiflexion/Plantar flexion intact Dressing - dressing C/D/I Motor Function - intact, moving foot and toes well on exam.   Past Medical History:  Diagnosis Date  . Anxiety   . Arthritis   . Heart murmur   . Hypertension     Assessment/Plan: 1 Day Post-Op Procedure(s) (LRB): TOTAL KNEE ARTHROPLASTY (Left) Active Problems:   Status post total left knee replacement  Estimated body mass index is 27.21 kg/m as calculated from the following:   Height as of this encounter: 5\' 4"  (1.626 m).   Weight as of this  encounter: 71.9 kg. Advance diet Up with therapy D/C IV fluids   Patient's anticipated LOS is less than 2 midnights, meeting these requirements: - Younger than 23 - Lives within 1 hour of care - Has a competent adult at home to recover with post-op recover - NO history of  - Chronic pain requiring opiods  - Diabetes  - Coronary Artery Disease  - Heart failure  - Heart attack  - Stroke  - DVT/VTE  - Cardiac arrhythmia  - Respiratory Failure/COPD  - Renal failure  - Anemia  - Advanced Liver disease  DVT Prophylaxis - Aspirin Weight bearing as tolerated.  Hemoglobin stable at 9.8 this morning. Plan is to go Home after hospital stay. Plan for discharge today following 1-2 sessions of therapy as long as she is meeting her goals. She does need a walker before discharge. Scheduled for OPPT. Follow up in the office in 2 weeks.   Griffith Citron, PA-C Orthopedic Surgery [phone] 09/27/2019, 7:30 AM

## 2019-09-27 NOTE — Progress Notes (Signed)
Physical Therapy Treatment Patient Details Name: Shannon [name] MRN: [id number] DOB: 09-21-1945 Today's Date: 09/27/2019    History of Present Illness Patient is 74 y.o. female s/p Lt TKA on 09/26/19 with PMH significant for HTN, OA, anxiety, back surgery.    PT Comments    Progressing toward goals. Will see for a second session to review HEP, should be ready for d/c later today   Follow Up Recommendations  Follow surgeon's recommendation for DC plan and follow-up therapies;Outpatient PT     Equipment Recommendations  Rolling walker with 5" wheels    Recommendations for Other Services       Precautions / Restrictions Precautions Precautions: Fall Restrictions Weight Bearing Restrictions: No LLE Weight Bearing: Weight bearing as tolerated    Mobility  Bed Mobility Overal bed mobility: Needs Assistance Bed Mobility: Supine to Sit     Supine to sit: Supervision     General bed mobility comments: for safety  Transfers Overall transfer level: Needs assistance Equipment used: Rolling walker (2 wheeled) Transfers: Sit to/from Stand Sit to Stand: Min guard         General transfer comment: cues for technique with hand placement    Ambulation/Gait Ambulation/Gait assistance: Min guard Gait Distance (Feet): 120 Feet Assistive device: Rolling walker (2 wheeled) Gait Pattern/deviations: Step-to pattern;Decreased stance time - left Gait velocity: decreased   General Gait Details: cues for RW position   Stairs             Wheelchair Mobility    Modified Rankin (Stroke Patients Only)       Balance Overall balance assessment: Needs assistance Sitting-balance support: Feet supported Sitting balance-Leahy Scale: Good     Standing balance support: During functional activity;Bilateral upper extremity supported Standing balance-Leahy Scale: Fair                              Cognition Arousal/Alertness: Awake/alert Behavior During Therapy:  WFL for tasks assessed/performed Overall Cognitive Status: Within Functional Limits for tasks assessed                                        Exercises      General Comments        Pertinent Vitals/Pain Pain Assessment: 0-10 Pain Score: 2  Pain Location: Lt Knee Pain Descriptors / Indicators: Sore Pain Intervention(s): Limited activity within patient's tolerance;Repositioned;Premedicated before session;Monitored during session    Home Living                      Prior Function            PT Goals (current goals can now be found in the care plan section) Acute Rehab PT Goals Patient Stated Goal: to recover and then decide if she needs to have Rt knee done PT Goal Formulation: With patient Time For Goal Achievement: 10/03/19 Potential to Achieve Goals: Good Progress towards PT goals: Progressing toward goals    Frequency    7X/week      PT Plan      Co-evaluation              AM-PAC PT "6 Clicks" Mobility   Outcome Measure  Help needed turning from your back to your side while in a flat bed without using bedrails?: A Little Help needed moving from lying on your back  to sitting on the side of a flat bed without using bedrails?: A Little Help needed moving to and from a bed to a chair (including a wheelchair)?: A Little Help needed standing up from a chair using your arms (e.g., wheelchair or bedside chair)?: A Little Help needed to walk in hospital room?: A Little Help needed climbing 3-5 steps with a railing? : A Little 6 Click Score: 18    End of Session Equipment Utilized During Treatment: Gait belt Activity Tolerance: Patient tolerated treatment well Patient left: in chair;with call bell/phone within reach;with chair alarm set;with family/visitor present Nurse Communication: Mobility status PT Visit Diagnosis: Muscle weakness (generalized) (M62.81);Difficulty in walking, not elsewhere classified (R26.2)     Time:  [id number] PT Time Calculation (min) (ACUTE ONLY): 16 min  Charges:  $Gait Training: 8-22 mins                     Baxter Flattery, PT   Acute Rehab Dept [facility]): [id number]   09/27/2019    [facility] 09/27/2019, 11:36 AM

## 2019-09-27 NOTE — Plan of Care (Signed)
Pt ready for DC home 

## 2019-09-27 NOTE — Progress Notes (Signed)
Note pt is Ortho Bundle - met briefly and confirming she has RW and aware plan OPPT at [facility].  No TOC needs.  Khrystal Jeanmarie, LCSW

## 2019-09-27 NOTE — Progress Notes (Signed)
   09/27/19 1200  PT Visit Information  Last PT Received On 09/27/19  Exercise focused session. Pt tolerated well; she is doing fantastic with her L knee ROM. Encouraged pt to continue exercises until her OPPT appointment on Tuesday  Assistance Needed +1  History of Present Illness Patient is 74 y.o. female s/p Lt TKA on 09/26/19 with PMH significant for HTN, OA, anxiety, back surgery.  Subjective Data  Patient Stated Goal to recover and then decide if she needs to have Rt knee done  Precautions  Precautions Fall;Knee  Precaution Comments reviewed no pillow under knee  Restrictions  Weight Bearing Restrictions No  LLE Weight Bearing WBAT  Pain Assessment  Pain Assessment No/denies pain  Pain Score 1  Pain Location Lt Knee  Pain Descriptors / Indicators Sore  Pain Intervention(s) Limited activity within patient's tolerance;Monitored during session  Cognition  Arousal/Alertness Awake/alert  Behavior During Therapy WFL for tasks assessed/performed  Overall Cognitive Status Within Functional Limits for tasks assessed  Balance  Overall balance assessment Needs assistance  Sitting-balance support Feet supported  Sitting balance-Leahy Scale Good  Standing balance support During functional activity;Bilateral upper extremity supported  Standing balance-Leahy Scale Fair  Total Joint Exercises  Ankle Circles/Pumps AROM;Both;10 reps  Quad Sets AROM;Both;10 reps  Heel Slides AROM;Left;10 reps  Short Arc Quad AROM;Left;10 reps  Hip ABduction/ADduction AROM;Left;10 reps  Straight Leg Raises AROM;Left;10 reps  Long Arc Quad  (reviewed/demo'd)  Goniometric ROM grossly 5 to 95 degrees AROM L knee  PT - End of Session  Activity Tolerance Patient tolerated treatment well  Nurse Communication Mobility status   PT - Assessment/Plan  PT Plan Current plan remains appropriate  PT Visit Diagnosis Muscle weakness (generalized) (M62.81);Difficulty in walking, not elsewhere classified (R26.2)  PT  Frequency (ACUTE ONLY) 7X/week  Follow Up Recommendations Follow surgeon's recommendation for DC plan and follow-up therapies;Outpatient PT  PT equipment Rolling walker with 5" wheels  AM-PAC PT "6 Clicks" Mobility Outcome Measure (Version 2)  Help needed turning from your back to your side while in a flat bed without using bedrails? 3  Help needed moving from lying on your back to sitting on the side of a flat bed without using bedrails? 3  Help needed moving to and from a bed to a chair (including a wheelchair)? 3  Help needed standing up from a chair using your arms (e.g., wheelchair or bedside chair)? 3  Help needed to walk in hospital room? 3  Help needed climbing 3-5 steps with a railing?  3  6 Click Score 18  Consider Recommendation of Discharge To: Home with [facility]  PT Goal Progression  Progress towards PT goals Progressing toward goals  Acute Rehab PT Goals  PT Goal Formulation With patient  Time For Goal Achievement 10/03/19  Potential to Achieve Goals Good  PT Time Calculation  PT Start Time (ACUTE ONLY) 1244  PT Stop Time (ACUTE ONLY) 1258  PT Time Calculation (min) (ACUTE ONLY) 14 min  PT General Charges  $$ ACUTE PT VISIT 1 Visit  PT Treatments  $Therapeutic Exercise 8-22 mins

## 2019-10-01 DIAGNOSIS — M25562 Pain in left knee: Secondary | ICD-10-CM | POA: Diagnosis not present

## 2019-10-01 DIAGNOSIS — M25662 Stiffness of left knee, not elsewhere classified: Secondary | ICD-10-CM | POA: Diagnosis not present

## 2019-10-03 DIAGNOSIS — M25662 Stiffness of left knee, not elsewhere classified: Secondary | ICD-10-CM | POA: Diagnosis not present

## 2019-10-03 DIAGNOSIS — M25562 Pain in left knee: Secondary | ICD-10-CM | POA: Diagnosis not present

## 2019-10-03 NOTE — Addendum Note (Signed)
Addendum  created 10/03/19 [id number] by Brennan Bailey, MD   Clinical Note Signed, Intraprocedure Blocks edited

## 2019-10-11 DIAGNOSIS — M25562 Pain in left knee: Secondary | ICD-10-CM | POA: Diagnosis not present

## 2019-10-11 DIAGNOSIS — M25662 Stiffness of left knee, not elsewhere classified: Secondary | ICD-10-CM | POA: Diagnosis not present

## 2019-10-14 DIAGNOSIS — M25662 Stiffness of left knee, not elsewhere classified: Secondary | ICD-10-CM | POA: Diagnosis not present

## 2019-10-14 DIAGNOSIS — M25562 Pain in left knee: Secondary | ICD-10-CM | POA: Diagnosis not present

## 2019-11-07 DIAGNOSIS — Z96652 Presence of left artificial knee joint: Secondary | ICD-10-CM | POA: Diagnosis not present

## 2019-11-07 DIAGNOSIS — Z471 Aftercare following joint replacement surgery: Secondary | ICD-10-CM | POA: Diagnosis not present

## 2019-11-20 DIAGNOSIS — Z471 Aftercare following joint replacement surgery: Secondary | ICD-10-CM | POA: Diagnosis not present

## 2019-11-20 DIAGNOSIS — Z96652 Presence of left artificial knee joint: Secondary | ICD-10-CM | POA: Diagnosis not present

## 2019-11-22 DIAGNOSIS — Z961 Presence of intraocular lens: Secondary | ICD-10-CM | POA: Diagnosis not present

## 2020-01-27 ENCOUNTER — Other Ambulatory Visit: Payer: Self-pay | Admitting: Internal Medicine

## 2020-01-27 DIAGNOSIS — Z1231 Encounter for screening mammogram for malignant neoplasm of breast: Secondary | ICD-10-CM

## 2020-01-29 DIAGNOSIS — Z23 Encounter for immunization: Secondary | ICD-10-CM | POA: Diagnosis not present

## 2020-03-09 ENCOUNTER — Ambulatory Visit
Admission: RE | Admit: 2020-03-09 | Discharge: 2020-03-09 | Disposition: A | Discharge status: Home / Self Care | Payer: Medicare Other | Source: Ambulatory Visit | Attending: Internal Medicine | Admitting: Internal Medicine

## 2020-03-09 ENCOUNTER — Other Ambulatory Visit: Payer: Self-pay

## 2020-03-09 DIAGNOSIS — Z1231 Encounter for screening mammogram for malignant neoplasm of breast: Secondary | ICD-10-CM

## 2020-04-16 DIAGNOSIS — Z0000 Encounter for general adult medical examination without abnormal findings: Secondary | ICD-10-CM | POA: Diagnosis not present

## 2020-04-16 DIAGNOSIS — E559 Vitamin D deficiency, unspecified: Secondary | ICD-10-CM | POA: Diagnosis not present

## 2020-04-16 DIAGNOSIS — K5790 Diverticulosis of intestine, part unspecified, without perforation or abscess without bleeding: Secondary | ICD-10-CM | POA: Diagnosis not present

## 2020-04-16 DIAGNOSIS — I10 Essential (primary) hypertension: Secondary | ICD-10-CM | POA: Diagnosis not present

## 2020-04-16 DIAGNOSIS — E782 Mixed hyperlipidemia: Secondary | ICD-10-CM | POA: Diagnosis not present

## 2020-04-16 DIAGNOSIS — I351 Nonrheumatic aortic (valve) insufficiency: Secondary | ICD-10-CM | POA: Diagnosis not present

## 2020-04-16 DIAGNOSIS — M1712 Unilateral primary osteoarthritis, left knee: Secondary | ICD-10-CM | POA: Diagnosis not present

## 2020-04-16 DIAGNOSIS — J309 Allergic rhinitis, unspecified: Secondary | ICD-10-CM | POA: Diagnosis not present

## 2020-04-16 DIAGNOSIS — Z79899 Other long term (current) drug therapy: Secondary | ICD-10-CM | POA: Diagnosis not present

## 2020-04-16 DIAGNOSIS — F341 Dysthymic disorder: Secondary | ICD-10-CM | POA: Diagnosis not present

## 2020-04-16 DIAGNOSIS — K219 Gastro-esophageal reflux disease without esophagitis: Secondary | ICD-10-CM | POA: Diagnosis not present

## 2020-04-16 DIAGNOSIS — Z1389 Encounter for screening for other disorder: Secondary | ICD-10-CM | POA: Diagnosis not present

## 2020-04-17 DIAGNOSIS — M25662 Stiffness of left knee, not elsewhere classified: Secondary | ICD-10-CM | POA: Diagnosis not present

## 2020-04-17 DIAGNOSIS — Z96652 Presence of left artificial knee joint: Secondary | ICD-10-CM | POA: Diagnosis not present

## 2020-04-17 DIAGNOSIS — M25562 Pain in left knee: Secondary | ICD-10-CM | POA: Diagnosis not present

## 2020-04-20 DIAGNOSIS — R3129 Other microscopic hematuria: Secondary | ICD-10-CM | POA: Diagnosis not present

## 2020-04-29 DIAGNOSIS — R3129 Other microscopic hematuria: Secondary | ICD-10-CM | POA: Diagnosis not present

## 2020-04-29 DIAGNOSIS — I351 Nonrheumatic aortic (valve) insufficiency: Secondary | ICD-10-CM | POA: Diagnosis not present

## 2020-04-29 DIAGNOSIS — R319 Hematuria, unspecified: Secondary | ICD-10-CM | POA: Diagnosis not present

## 2020-06-01 DIAGNOSIS — R3129 Other microscopic hematuria: Secondary | ICD-10-CM | POA: Diagnosis not present

## 2020-06-04 DIAGNOSIS — I10 Essential (primary) hypertension: Secondary | ICD-10-CM | POA: Diagnosis not present

## 2020-06-04 DIAGNOSIS — R3129 Other microscopic hematuria: Secondary | ICD-10-CM | POA: Diagnosis not present

## 2020-06-04 DIAGNOSIS — I351 Nonrheumatic aortic (valve) insufficiency: Secondary | ICD-10-CM | POA: Diagnosis not present

## 2020-06-12 DIAGNOSIS — L84 Corns and callosities: Secondary | ICD-10-CM | POA: Diagnosis not present

## 2020-06-12 DIAGNOSIS — M79672 Pain in left foot: Secondary | ICD-10-CM | POA: Diagnosis not present

## 2020-06-12 DIAGNOSIS — M21612 Bunion of left foot: Secondary | ICD-10-CM | POA: Diagnosis not present

## 2020-06-12 DIAGNOSIS — M2022 Hallux rigidus, left foot: Secondary | ICD-10-CM | POA: Diagnosis not present

## 2020-06-15 ENCOUNTER — Other Ambulatory Visit (HOSPITAL_COMMUNITY): Payer: Self-pay | Admitting: Orthopedic Surgery

## 2020-06-25 ENCOUNTER — Encounter (HOSPITAL_BASED_OUTPATIENT_CLINIC_OR_DEPARTMENT_OTHER): Payer: Self-pay | Admitting: Orthopedic Surgery

## 2020-06-25 ENCOUNTER — Other Ambulatory Visit: Payer: Self-pay

## 2020-06-29 ENCOUNTER — Encounter (HOSPITAL_BASED_OUTPATIENT_CLINIC_OR_DEPARTMENT_OTHER)
Admission: RE | Admit: 2020-06-29 | Discharge: 2020-06-29 | Disposition: A | Discharge status: Home / Self Care | Payer: Medicare Other | Source: Ambulatory Visit | Attending: Orthopedic Surgery | Admitting: Orthopedic Surgery

## 2020-06-29 ENCOUNTER — Other Ambulatory Visit (HOSPITAL_COMMUNITY)
Admission: RE | Admit: 2020-06-29 | Discharge: 2020-06-29 | Disposition: A | Discharge status: Home / Self Care | Payer: Medicare Other | Source: Ambulatory Visit | Attending: Orthopedic Surgery | Admitting: Orthopedic Surgery

## 2020-06-29 DIAGNOSIS — Z01812 Encounter for preprocedural laboratory examination: Secondary | ICD-10-CM | POA: Diagnosis not present

## 2020-06-29 DIAGNOSIS — Z20822 Contact with and (suspected) exposure to covid-19: Secondary | ICD-10-CM | POA: Insufficient documentation

## 2020-06-29 LAB — BASIC METABOLIC PANEL
Anion gap: 11 (ref 5–15)
BUN: 11 mg/dL (ref 8–23)
CO2: 24 mmol/L (ref 22–32)
Calcium: 9.4 mg/dL (ref 8.9–10.3)
Chloride: 101 mmol/L (ref 98–111)
Creatinine, Ser: 0.74 mg/dL (ref 0.44–1.00)
GFR, Estimated: >60 mL/min (ref >60)
Glucose, Bld: 90 mg/dL (ref 70–99)
Potassium: 4.4 mmol/L (ref 3.5–5.1)
Sodium: 136 mmol/L (ref 135–145)

## 2020-06-29 NOTE — Progress Notes (Signed)

## 2020-06-30 LAB — SARS CORONAVIRUS 2 (TAT 6-24 HRS): SARS Coronavirus 2: NEGATIVE

## 2020-07-01 NOTE — Anesthesia Preprocedure Evaluation (Addendum)
Anesthesia Evaluation  Patient identified by MRN, date of birth, ID band Patient awake    Reviewed: Allergy & Precautions, NPO status , Patient's Chart, lab work & pertinent test results  History of Anesthesia Complications Negative for: history of anesthetic complications  Airway Mallampati: II  TM Distance: >3 FB Neck ROM: Full  Mouth opening: Limited Mouth Opening  Dental  (+) Missing,    Pulmonary neg pulmonary ROS,    Pulmonary exam normal        Cardiovascular hypertension, Pt. on medications Normal cardiovascular exam  TTE 2016: EF 55-60%, mild to moderate AR, mild MR    Neuro/Psych Anxiety negative neurological ROS     GI/Hepatic negative GI ROS, Neg liver ROS,   Endo/Other  negative endocrine ROS  Renal/GU negative Renal ROS  negative genitourinary   Musculoskeletal  (+) Arthritis , left foot hallux rigidus   Abdominal   Peds  Hematology negative hematology ROS (+)   Anesthesia Other Findings Day of surgery medications reviewed with patient.  Reproductive/Obstetrics negative OB ROS                           Anesthesia Physical Anesthesia Plan  ASA: II  Anesthesia Plan: General   Post-op Pain Management: GA combined w/ Regional for post-op pain   Induction: Intravenous  PONV Risk Score and Plan: 3 and Treatment may vary due to age or medical condition, Dexamethasone and Ondansetron  Airway Management Planned: LMA  Additional Equipment: None  Intra-op Plan:   Post-operative Plan: Extubation in OR  Informed Consent: I have reviewed the patients History and Physical, chart, labs and discussed the procedure including the risks, benefits and alternatives for the proposed anesthesia with the patient or authorized representative who has indicated his/her understanding and acceptance.     Dental advisory given  Plan Discussed with: CRNA  Anesthesia Plan Comments:         Anesthesia Quick Evaluation

## 2020-07-02 ENCOUNTER — Encounter (HOSPITAL_BASED_OUTPATIENT_CLINIC_OR_DEPARTMENT_OTHER): Payer: Self-pay | Admitting: Orthopedic Surgery

## 2020-07-02 ENCOUNTER — Ambulatory Visit (HOSPITAL_BASED_OUTPATIENT_CLINIC_OR_DEPARTMENT_OTHER): Payer: Medicare Other | Admitting: Anesthesiology

## 2020-07-02 ENCOUNTER — Other Ambulatory Visit: Payer: Self-pay

## 2020-07-02 ENCOUNTER — Encounter (HOSPITAL_BASED_OUTPATIENT_CLINIC_OR_DEPARTMENT_OTHER)
Admission: RE | Disposition: A | Discharge status: Home / Self Care | Payer: Self-pay | Source: Home / Self Care | Attending: Orthopedic Surgery

## 2020-07-02 ENCOUNTER — Ambulatory Visit (HOSPITAL_BASED_OUTPATIENT_CLINIC_OR_DEPARTMENT_OTHER)
Admission: RE | Admit: 2020-07-02 | Discharge: 2020-07-02 | Disposition: A | Discharge status: Home / Self Care | Payer: Medicare Other | Attending: Orthopedic Surgery | Admitting: Orthopedic Surgery

## 2020-07-02 DIAGNOSIS — Z881 Allergy status to other antibiotic agents status: Secondary | ICD-10-CM | POA: Diagnosis not present

## 2020-07-02 DIAGNOSIS — G8918 Other acute postprocedural pain: Secondary | ICD-10-CM | POA: Diagnosis not present

## 2020-07-02 DIAGNOSIS — Z880 Allergy status to penicillin: Secondary | ICD-10-CM | POA: Insufficient documentation

## 2020-07-02 DIAGNOSIS — Z79899 Other long term (current) drug therapy: Secondary | ICD-10-CM | POA: Diagnosis not present

## 2020-07-02 DIAGNOSIS — M2022 Hallux rigidus, left foot: Secondary | ICD-10-CM

## 2020-07-02 DIAGNOSIS — M21612 Bunion of left foot: Secondary | ICD-10-CM | POA: Insufficient documentation

## 2020-07-02 DIAGNOSIS — Z885 Allergy status to narcotic agent status: Secondary | ICD-10-CM | POA: Diagnosis not present

## 2020-07-02 DIAGNOSIS — I10 Essential (primary) hypertension: Secondary | ICD-10-CM | POA: Diagnosis not present

## 2020-07-02 DIAGNOSIS — Z96652 Presence of left artificial knee joint: Secondary | ICD-10-CM | POA: Insufficient documentation

## 2020-07-02 HISTORY — PX: ARTHRODESIS METATARSALPHALANGEAL JOINT (MTPJ): SHX6566

## 2020-07-02 SURGERY — FUSION, JOINT, GREAT TOE
Anesthesia: General | Site: Foot | Laterality: Left

## 2020-07-02 MED ORDER — PROMETHAZINE HCL 25 MG/ML IJ SOLN: 6.2500 mg | INTRAMUSCULAR | Status: DC | PRN

## 2020-07-02 MED ORDER — 0.9 % SODIUM CHLORIDE (POUR BTL) OPTIME
TOPICAL | Status: DC | PRN
  Administered 2020-07-02: 200 mL

## 2020-07-02 MED ORDER — LIDOCAINE 2% (20 MG/ML) 5 ML SYRINGE
INTRAMUSCULAR | Status: DC | PRN
  Administered 2020-07-02: 60 mg via INTRAVENOUS

## 2020-07-02 MED ORDER — EPHEDRINE 5 MG/ML INJ
INTRAVENOUS | Status: AC
  Filled 2020-07-02: qty 10

## 2020-07-02 MED ORDER — PROPOFOL 10 MG/ML IV BOLUS
INTRAVENOUS | Status: AC
  Filled 2020-07-02: qty 40

## 2020-07-02 MED ORDER — ACETAMINOPHEN 500 MG PO TABS
1000.0000 mg | ORAL_TABLET | Freq: Once | ORAL | Status: AC
  Administered 2020-07-02: 1000 mg via ORAL

## 2020-07-02 MED ORDER — CEFAZOLIN SODIUM-DEXTROSE 2-3 GM-%(50ML) IV SOLR
INTRAVENOUS | Status: DC | PRN
  Administered 2020-07-02: 2 g via INTRAVENOUS

## 2020-07-02 MED ORDER — SODIUM CHLORIDE 0.9 % IV SOLN: INTRAVENOUS | Status: DC

## 2020-07-02 MED ORDER — BUPIVACAINE-EPINEPHRINE (PF) 0.5% -1:200000 IJ SOLN
INTRAMUSCULAR | Status: DC | PRN
  Administered 2020-07-02: 20 mL via PERINEURAL
  Administered 2020-07-02: 10 mL via PERINEURAL

## 2020-07-02 MED ORDER — VANCOMYCIN HCL 500 MG IV SOLR
INTRAVENOUS | Status: DC | PRN
  Administered 2020-07-02: 500 mg via TOPICAL

## 2020-07-02 MED ORDER — ACETAMINOPHEN 500 MG PO TABS
ORAL_TABLET | ORAL | Status: AC
  Filled 2020-07-02: qty 2

## 2020-07-02 MED ORDER — LIDOCAINE 2% (20 MG/ML) 5 ML SYRINGE
INTRAMUSCULAR | Status: AC
  Filled 2020-07-02: qty 5

## 2020-07-02 MED ORDER — TRAMADOL HCL 50 MG PO TABS: 50.0000 mg | ORAL_TABLET | Freq: Four times a day (QID) | ORAL | 0 refills | Status: DC | PRN

## 2020-07-02 MED ORDER — EPHEDRINE SULFATE-NACL 50-0.9 MG/10ML-% IV SOSY
PREFILLED_SYRINGE | INTRAVENOUS | Status: DC | PRN
  Administered 2020-07-02 (×2): 5 mg via INTRAVENOUS

## 2020-07-02 MED ORDER — VANCOMYCIN HCL 500 MG IV SOLR
INTRAVENOUS | Status: AC
  Filled 2020-07-02: qty 500

## 2020-07-02 MED ORDER — CEFAZOLIN SODIUM 1 G IJ SOLR
INTRAMUSCULAR | Status: AC
  Filled 2020-07-02: qty 20

## 2020-07-02 MED ORDER — FENTANYL CITRATE (PF) 100 MCG/2ML IJ SOLN
INTRAMUSCULAR | Status: AC
  Filled 2020-07-02: qty 2

## 2020-07-02 MED ORDER — FENTANYL CITRATE (PF) 100 MCG/2ML IJ SOLN
25.0000 ug | INTRAMUSCULAR | Status: DC | PRN
  Administered 2020-07-02: 25 ug via INTRAVENOUS

## 2020-07-02 MED ORDER — DEXAMETHASONE SODIUM PHOSPHATE 10 MG/ML IJ SOLN
INTRAMUSCULAR | Status: DC | PRN
  Administered 2020-07-02: 5 mg via INTRAVENOUS

## 2020-07-02 MED ORDER — PROPOFOL 10 MG/ML IV BOLUS
INTRAVENOUS | Status: DC | PRN
  Administered 2020-07-02: 120 mg via INTRAVENOUS
  Administered 2020-07-02: 30 mg via INTRAVENOUS

## 2020-07-02 MED ORDER — ONDANSETRON HCL 4 MG/2ML IJ SOLN
INTRAMUSCULAR | Status: DC | PRN
  Administered 2020-07-02: 4 mg via INTRAVENOUS

## 2020-07-02 MED ORDER — DOCUSATE SODIUM 100 MG PO CAPS: 100.0000 mg | ORAL_CAPSULE | Freq: Two times a day (BID) | ORAL | 0 refills | Status: DC

## 2020-07-02 MED ORDER — CLONIDINE HCL (ANALGESIA) 100 MCG/ML EP SOLN
EPIDURAL | Status: DC | PRN
  Administered 2020-07-02: 33 ug
  Administered 2020-07-02: 67 ug

## 2020-07-02 MED ORDER — DEXAMETHASONE SODIUM PHOSPHATE 10 MG/ML IJ SOLN
INTRAMUSCULAR | Status: AC
  Filled 2020-07-02: qty 1

## 2020-07-02 MED ORDER — SENNA 8.6 MG PO TABS: 2.0000 | ORAL_TABLET | Freq: Two times a day (BID) | ORAL | 0 refills | Status: DC

## 2020-07-02 MED ORDER — LACTATED RINGERS IV SOLN: INTRAVENOUS | Status: DC

## 2020-07-02 MED ORDER — VANCOMYCIN HCL IN DEXTROSE 1-5 GM/200ML-% IV SOLN: 1000.0000 mg | INTRAVENOUS | Status: DC

## 2020-07-02 MED ORDER — VANCOMYCIN HCL IN DEXTROSE 1-5 GM/200ML-% IV SOLN
INTRAVENOUS | Status: AC
  Filled 2020-07-02: qty 200

## 2020-07-02 MED ORDER — ONDANSETRON HCL 4 MG/2ML IJ SOLN
INTRAMUSCULAR | Status: AC
  Filled 2020-07-02: qty 2

## 2020-07-02 MED ORDER — MIDAZOLAM HCL 2 MG/2ML IJ SOLN
INTRAMUSCULAR | Status: AC
  Filled 2020-07-02: qty 2

## 2020-07-02 MED ORDER — BUPIVACAINE HCL (PF) 0.5 % IJ SOLN
INTRAMUSCULAR | Status: AC
  Filled 2020-07-02: qty 30

## 2020-07-02 MED ORDER — FENTANYL CITRATE (PF) 100 MCG/2ML IJ SOLN
100.0000 ug | Freq: Once | INTRAMUSCULAR | Status: AC
Start: 2020-07-02 — End: 2020-07-02
  Administered 2020-07-02: 50 ug via INTRAVENOUS

## 2020-07-02 MED ORDER — MIDAZOLAM HCL 2 MG/2ML IJ SOLN
2.0000 mg | Freq: Once | INTRAMUSCULAR | Status: AC
  Administered 2020-07-02: 1 mg via INTRAVENOUS

## 2020-07-02 MED ORDER — LIDOCAINE-EPINEPHRINE 1 %-1:100000 IJ SOLN
INTRAMUSCULAR | Status: AC
  Filled 2020-07-02: qty 1

## 2020-07-02 SURGICAL SUPPLY — 84 items
APL PRP STRL LF DISP 70% ISPRP (MISCELLANEOUS) ×1
BANDAGE ESMARK 6X9 LF (GAUZE/BANDAGES/DRESSINGS) IMPLANT
BIT DRILL 2.7XCANN QCK CNCT (BIT) IMPLANT
BIT DRILL CANN 2.7 (BIT) ×2
BIT DRILL Q-C 2.0 DIA 100 (BIT) ×1 IMPLANT
BIT DRL 2.7XCANN QCK CNCT (BIT) ×1
BLADE AVERAGE 25X9 (BLADE) IMPLANT
BLADE MICRO SAGITTAL (BLADE) IMPLANT
BLADE OSC/SAG .038X5.5 CUT EDG (BLADE) IMPLANT
BLADE SURG 15 STRL LF DISP TIS (BLADE) ×2 IMPLANT
BLADE SURG 15 STRL SS (BLADE) ×6
BNDG CMPR 9X6 STRL LF SNTH (GAUZE/BANDAGES/DRESSINGS)
BNDG COHESIVE 4X5 TAN STRL (GAUZE/BANDAGES/DRESSINGS) IMPLANT
BNDG COHESIVE 6X5 TAN STRL LF (GAUZE/BANDAGES/DRESSINGS) IMPLANT
BNDG CONFORM 3 STRL LF (GAUZE/BANDAGES/DRESSINGS) ×2 IMPLANT
BNDG ELASTIC 4X5.8 VLCR STR LF (GAUZE/BANDAGES/DRESSINGS) ×2 IMPLANT
BNDG ESMARK 6X9 LF (GAUZE/BANDAGES/DRESSINGS)
BOOT STEPPER DURA LG (SOFTGOODS) IMPLANT
BOOT STEPPER DURA MED (SOFTGOODS) IMPLANT
BOOT STEPPER DURA SM (SOFTGOODS) ×1 IMPLANT
BOOT STEPPER DURA XLG (SOFTGOODS) IMPLANT
CHLORAPREP W/TINT 26 (MISCELLANEOUS) ×2 IMPLANT
COVER BACK TABLE 60X90IN (DRAPES) ×2 IMPLANT
COVER WAND RF STERILE (DRAPES) IMPLANT
CUFF TOURN SGL QUICK 24 (TOURNIQUET CUFF) ×2
CUFF TOURN SGL QUICK 34 (TOURNIQUET CUFF)
CUFF TRNQT CYL 24X4X16.5-23 (TOURNIQUET CUFF) IMPLANT
CUFF TRNQT CYL 34X4.125X (TOURNIQUET CUFF) IMPLANT
DRAPE EXTREMITY T 121X128X90 (DISPOSABLE) ×2 IMPLANT
DRAPE OEC MINIVIEW 54X84 (DRAPES) ×2 IMPLANT
DRAPE U-SHAPE 47X51 STRL (DRAPES) ×2 IMPLANT
DRSG MEPITEL 4X7.2 (GAUZE/BANDAGES/DRESSINGS) ×2 IMPLANT
DRSG PAD ABDOMINAL 8X10 ST (GAUZE/BANDAGES/DRESSINGS) ×2 IMPLANT
ELECT REM PT RETURN 9FT ADLT (ELECTROSURGICAL) ×2
ELECTRODE REM PT RTRN 9FT ADLT (ELECTROSURGICAL) ×1 IMPLANT
GAUZE SPONGE 4X4 12PLY STRL (GAUZE/BANDAGES/DRESSINGS) ×2 IMPLANT
GLOVE BIOGEL M 6.5 STRL (GLOVE) ×2 IMPLANT
GLOVE SRG 8 PF TXTR STRL LF DI (GLOVE) ×2 IMPLANT
GLOVE SURG ENC MOIS LTX SZ8 (GLOVE) ×2 IMPLANT
GLOVE SURG LTX SZ8 (GLOVE) ×2 IMPLANT
GLOVE SURG POLYISO LF SZ7 (GLOVE) ×1 IMPLANT
GLOVE SURG UNDER POLY LF SZ7 (GLOVE) ×3 IMPLANT
GLOVE SURG UNDER POLY LF SZ8 (GLOVE) ×4
GOWN STRL REUS W/ TWL LRG LVL3 (GOWN DISPOSABLE) ×1 IMPLANT
GOWN STRL REUS W/ TWL XL LVL3 (GOWN DISPOSABLE) ×2 IMPLANT
GOWN STRL REUS W/TWL LRG LVL3 (GOWN DISPOSABLE) ×4
GOWN STRL REUS W/TWL XL LVL3 (GOWN DISPOSABLE) ×4
GUIDEWIRE PIN ORTH 6X1.6XSMTH (WIRE) IMPLANT
K-WIRE 1.6 (WIRE) ×2
NEEDLE HYPO 22GX1.5 SAFETY (NEEDLE) IMPLANT
PACK BASIN DAY SURGERY FS (CUSTOM PROCEDURE TRAY) ×2 IMPLANT
PAD CAST 4YDX4 CTTN HI CHSV (CAST SUPPLIES) ×1 IMPLANT
PADDING CAST ABS 4INX4YD NS (CAST SUPPLIES)
PADDING CAST ABS COTTON 4X4 ST (CAST SUPPLIES) IMPLANT
PADDING CAST COTTON 4X4 STRL (CAST SUPPLIES) ×2
PADDING CAST COTTON 6X4 STRL (CAST SUPPLIES) IMPLANT
PENCIL SMOKE EVACUATOR (MISCELLANEOUS) ×2 IMPLANT
PLATE TUB 39 W/COLLAR 5H (Plate) ×1 IMPLANT
SANITIZER HAND PURELL 535ML FO (MISCELLANEOUS) ×2 IMPLANT
SCREW CANN THRD 5/4X36 (Screw) ×1 IMPLANT
SCREW CORT 2.5X12X2.7XST SM (Screw) IMPLANT
SCREW CORTICAL 2.7X12 (Screw) ×2 IMPLANT
SCREW CORTICAL 2.7X18MM (Screw) ×2 IMPLANT
SCREW CORTICAL LOCK 2.7X22 (Screw) ×1 IMPLANT
SHEET MEDIUM DRAPE 40X70 STRL (DRAPES) ×2 IMPLANT
SLEEVE SCD COMPRESS KNEE MED (STOCKING) ×2 IMPLANT
SPLINT FAST PLASTER 5X30 (CAST SUPPLIES)
SPLINT PLASTER CAST FAST 5X30 (CAST SUPPLIES) IMPLANT
SPONGE LAP 18X18 RF (DISPOSABLE) ×2 IMPLANT
SPONGE SURGIFOAM ABS GEL 12-7 (HEMOSTASIS) IMPLANT
STOCKINETTE 6  STRL (DRAPES) ×2
STOCKINETTE 6 STRL (DRAPES) ×1 IMPLANT
SUCTION FRAZIER HANDLE 10FR (MISCELLANEOUS) ×2
SUCTION TUBE FRAZIER 10FR DISP (MISCELLANEOUS) ×1 IMPLANT
SUT ETHILON 3 0 PS 1 (SUTURE) ×2 IMPLANT
SUT MNCRL AB 3-0 PS2 18 (SUTURE) ×2 IMPLANT
SUT VIC AB 0 SH 27 (SUTURE) IMPLANT
SUT VIC AB 2-0 SH 27 (SUTURE) ×2
SUT VIC AB 2-0 SH 27XBRD (SUTURE) ×1 IMPLANT
SYR BULB EAR ULCER 3OZ GRN STR (SYRINGE) ×2 IMPLANT
SYR CONTROL 10ML LL (SYRINGE) IMPLANT
TOWEL GREEN STERILE FF (TOWEL DISPOSABLE) ×4 IMPLANT
TUBE CONNECTING 20X1/4 (TUBING) ×2 IMPLANT
UNDERPAD 30X36 HEAVY ABSORB (UNDERPADS AND DIAPERS) ×2 IMPLANT

## 2020-07-02 NOTE — H&P (Signed)
Shannon [name] is an 75 y.o. female.   Chief Complaint: left foot pain HPI: The patient is a 75 year old female without significant past medical history.  She has a long history of left forefoot pain due to hallux rigidus and a bunion deformity.  She has failed nonoperative treatment including activity modification, oral anti-inflammatories and shoewear modification.  She presents today for hallux MP joint arthrodesis.  Past Medical History:  Diagnosis Date  . Anxiety   . Arthritis   . Heart murmur   . Hypertension     Past Surgical History:  Procedure Laterality Date  . ABDOMINAL HYSTERECTOMY    . APPENDECTOMY    . BACK SURGERY  1981  . CHOLECYSTECTOMY    . EYE SURGERY Bilateral    Cataract  . TONSILLECTOMY    . TOTAL KNEE ARTHROPLASTY Left 09/26/2019   Procedure: TOTAL KNEE ARTHROPLASTY;  Surgeon: Paralee Cancel, MD;  Location: [facility];  Service: Orthopedics;  Laterality: Left;  70 mins  . WRIST SURGERY Right     History reviewed. No pertinent family history. Social History:  reports that she has never smoked. She has never used smokeless tobacco. She reports current alcohol use. She reports that she does not use drugs.  Allergies:  Allergies  Allergen Reactions  . Cephalexin Hives  . Morphine Other (See Comments) and Palpitations  . Penicillins Hives  . Norco [Hydrocodone-Acetaminophen] Hives  . Oxycodone Hives    Medications Prior to Admission  Medication Sig Dispense Refill  . ALPRAZolam (XANAX) 0.25 MG tablet Take 0.125-0.25 mg by mouth at bedtime.     . Cholecalciferol (VITAMIN D3) 50 MCG (2000 UT) TABS Take 2,000 mg by mouth daily.    Marland Kitchen ibuprofen (ADVIL) 400 MG tablet Take 400 mg by mouth every 6 (six) hours as needed.    Marland Kitchen losartan-hydrochlorothiazide (HYZAAR) 100-12.5 MG tablet Take 1 tablet by mouth daily. Patient states she takes 1/2 tablet now    . rosuvastatin (CRESTOR) 20 MG tablet Take 20 mg by mouth every other day.    . verapamil (CALAN-SR) 240 MG CR  tablet Take 240 mg by mouth daily.    . vitamin B-12 (CYANOCOBALAMIN) 1000 MCG tablet Take 1,000 mcg by mouth daily.      No results found for this or any previous visit (from the past 48 hour(s)). No results found.  Review of Systems no recent fever, chills, nausea, vomiting or changes in her appetite Blood pressure (!) 106/50, pulse 62, temperature 97.8 F (36.6 C), temperature source Oral, resp. rate (!) 8, height 5\' 4"  (1.626 m), weight 62.2 kg, SpO2 100 %. Physical Exam  Well-nourished well-developed woman in no apparent distress.  Alert and oriented x4.  Normal mood and affect.  Gait is normal.  The left hallux has a moderate bunion deformity.  There is a small corn at the second toe.  Skin is otherwise healthy and intact.  Decreased range of motion at the hallux MP joint.  No lymphadenopathy.  Pulses are palpable in the foot.  Normal sensibility to light touch dorsally and plantarly at the forefoot.   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  The risks and benefits of the alternative treatment options have been discussed in detail.  The patient wishes to proceed with surgery and specifically understands risks of bleeding, infection, nerve damage, blood clots, need for additional surgery, amputation and death.   Wylene Simmer, MD Jul 23, 2020, 7:20 AM

## 2020-07-02 NOTE — Anesthesia Procedure Notes (Signed)
Anesthesia Regional Block: Adductor canal block   Pre-Anesthetic Checklist: ,, timeout performed, Correct Patient, Correct Site, Correct Laterality, Correct Procedure, Correct Position, site marked, Risks and benefits discussed, pre-op evaluation,  At surgeon's request and post-op pain management  Laterality: Left  Prep: Maximum Sterile Barrier Precautions used, chloraprep       Needles:  Injection technique: Single-shot  Needle Type: Echogenic Stimulator Needle     Needle Length: 9cm  Needle Gauge: 22     Additional Needles:   Procedures:,,,, ultrasound used (permanent image in chart),,,,  Narrative:  Start time: 07/02/2020 7:01 AM End time: 07/02/2020 7:04 AM Injection made incrementally with aspirations every 5 mL.  Performed by: Personally  Anesthesiologist: Brennan Bailey, MD  Additional Notes: Risks, benefits, and alternative discussed. Patient gave consent for procedure. Patient prepped and draped in sterile fashion. Sedation administered, patient remains easily responsive to voice. Relevant anatomy identified with ultrasound guidance. Local anesthetic given in 5cc increments with no signs or symptoms of intravascular injection. No pain or paraesthesias with injection. Patient monitored throughout procedure with signs of LAST or immediate complications. Tolerated well. Ultrasound image placed in chart.  Tawny Asal, MD

## 2020-07-02 NOTE — Discharge Instructions (Addendum)
Wylene Simmer, MD [facility]  Please read the following information regarding your care after surgery.  Medications  You only need a prescription for the narcotic pain medicine (ex. oxycodone, Percocet, Norco).  All of the other medicines listed below are available over the counter. X Ibuprofen that you have at home for the first 3 days after surgery. X acetominophen (Tylenol) 650 mg every 4-6 hours as you need for minor to moderate pain X Tramadol as prescribed for severe pain  Narcotic pain medicine (ex. oxycodone, Percocet, Vicodin) will cause constipation.  To prevent this problem, take the following medicines while you are taking any pain medicine. X docusate sodium (Colace) 100 mg twice a day X senna (Senokot) 2 tablets twice a day  Weight Bearing X Bear weight only on your heel on your operated foot in the CAM boot.   Cast / Splint / Dressing X Keep your splint, cast or dressing clean and dry.  Don't put anything (coat hanger, pencil, etc) down inside of it.  If it gets damp, use a hair dryer on the cool setting to dry it.  If it gets soaked, call the office to schedule an appointment for a cast change.   After your dressing, cast or splint is removed; you may shower, but do not soak or scrub the wound.  Allow the water to run over it, and then gently pat it dry.  Swelling It is normal for you to have swelling where you had surgery.  To reduce swelling and pain, keep your toes above your nose for at least 3 days after surgery.  It may be necessary to keep your foot or leg elevated for several weeks.  If it hurts, it should be elevated.  Follow Up Call my office at [phone] when you are discharged from the hospital or surgery center to schedule an appointment to be seen two weeks after surgery.  Call my office at [phone] if you develop a fever >101.5 F, nausea, vomiting, bleeding from the surgical site or severe pain.     Post Anesthesia Home Care  Instructions  Activity: Get plenty of rest for the remainder of the day. A responsible individual must stay with you for 24 hours following the procedure.  For the next 24 hours, DO NOT: -Drive a car -Paediatric nurse -Drink alcoholic beverages -Take any medication unless instructed by your physician -Make any legal decisions or sign important papers.  Meals: Start with liquid foods such as gelatin or soup. Progress to regular foods as tolerated. Avoid greasy, spicy, heavy foods. If nausea and/or vomiting occur, drink only clear liquids until the nausea and/or vomiting subsides. Call your physician if vomiting continues.  Special Instructions/Symptoms: Your throat may feel dry or sore from the anesthesia or the breathing tube placed in your throat during surgery. If this causes discomfort, gargle with warm salt water. The discomfort should disappear within 24 hours.  If you had a scopolamine patch placed behind your ear for the management of post- operative nausea and/or vomiting:  1. The medication in the patch is effective for 72 hours, after which it should be removed.  Wrap patch in a tissue and discard in the trash. Wash hands thoroughly with soap and water. 2. You may remove the patch earlier than 72 hours if you experience unpleasant side effects which may include dry mouth, dizziness or visual disturbances. 3. Avoid touching the patch. Wash your hands with soap and water after contact with the patch.    Regional Anesthesia  Blocks  1. Numbness or the inability to move the "blocked" extremity may last from 3-48 hours after placement. The length of time depends on the medication injected and your individual response to the medication. If the numbness is not going away after 48 hours, call your surgeon.  2. The extremity that is blocked will need to be protected until the numbness is gone and the  Strength has returned. Because you cannot feel it, you will need to take extra care to  avoid injury. Because it may be weak, you may have difficulty moving it or using it. You may not know what position it is in without looking at it while the block is in effect.  3. For blocks in the legs and feet, returning to weight bearing and walking needs to be done carefully. You will need to wait until the numbness is entirely gone and the strength has returned. You should be able to move your leg and foot normally before you try and bear weight or walk. You will need someone to be with you when you first try to ensure you do not fall and possibly risk injury.  4. Bruising and tenderness at the needle site are common side effects and will resolve in a few days.  5. Persistent numbness or new problems with movement should be communicated to the surgeon or the [place] [phone] [place] [phone]).  No Tylenol until 12:40 PM.

## 2020-07-02 NOTE — Op Note (Signed)
07/02/2020  8:42 AM  PATIENT:  Shannon [name]  75 y.o. female  PRE-OPERATIVE DIAGNOSIS: 1.  left foot hallux rigidus      2.  Left foot bunion  POST-OPERATIVE DIAGNOSIS:  Same  Procedure(s): 1.  Left silver bunionectomy 2.  Left hallux metatarsophalangeal joint arthrodesis 3.  Left foot AP and lateral radiographs  SURGEON:  Wylene Simmer, MD  ASSISTANT: Mechele Claude, PA-C  ANESTHESIA:   General, regional  EBL:  minimal   TOURNIQUET:   Total Tourniquet Time Documented: Thigh (Left) - 34 minutes Total: Thigh (Left) - 34 minutes  COMPLICATIONS:  None apparent  DISPOSITION:  Extubated, awake and stable to recovery.  INDICATION FOR PROCEDURE: The patient is a 75 year old female with a prominent left foot bunion deformity and signs and symptoms of hallux rigidus.  She has failed nonoperative treatment to date including activity modification, oral anti-inflammatories and shoewear modification.  She presents today for surgical treatment of these left painful forefoot conditions.  The risks and benefits of the alternative treatment options have been discussed in detail.  The patient wishes to proceed with surgery and specifically understands risks of bleeding, infection, nerve damage, blood clots, need for additional surgery, amputation and death.  PROCEDURE IN DETAIL:  After pre operative consent was obtained, and the correct operative site was identified, the patient was brought to the operating room and placed supine on the OR table.  Anesthesia was administered.  Pre-operative antibiotics were administered.  A surgical timeout was taken.  The left lower extremity was prepped and draped in standard sterile fashion with a tourniquet around the thigh.  The extremity was elevated and the tourniquet was inflated to 250 mmHg.  A longitudinal incision was then made over the hallux MP joint.  Dissection was carried sharply down to the subcutaneous tissues.  The extensor tendons were protected.   The joint capsule was incised and elevated medially and laterally.  The collateral ligaments were released exposing the metatarsal head.  The patient was noted to have a prominent hypertrophic medial eminence.  A silver bunionectomy was performed removing the medial eminence in line with the first metatarsal shaft with a rondure.  The cut surface of bone was smoothed.  A concave reamer was used to remove the remaining articular cartilage and subchondral bone from the head of the metatarsal.  A convex reamer was then used to remove the remaining articular cartilage and subchondral bone from the base of the proximal phalanx.  The wound was irrigated copiously.  A small drill bit was used to perforate both sides of the joint leaving the resultant bone graft in place.  The joint was reduced and provisionally pinned.  AP and lateral radiographs confirmed appropriate position of the guidepin.  A 4 mm partially-threaded stainless steel cannulated screw from the [employer] cannulated screw set was inserted.  It was tightened and compressed the joint securely.  A 5 hole one quarter tubular plate from the stainless steel mini frag set was then contoured to fit the dorsum of the joint.  It was secured proximally with 2 bicortical screws and distally with 2 bicortical screws.  Final AP and lateral radiographs confirmed appropriate position of the hardware and appropriate reduction of the hallux MP joint.  Interval arthrodesis is noted.  The wound was irrigated copiously and sprinkled with vancomycin powder.  Subcutaneous tissues were approximated with Vicryl.  Skin incision was closed with nylon.  Sterile dressings were applied followed by a cam boot.  The tourniquet was  released after application of the dressings.  The patient was awakened from anesthesia and transported to the recovery room in stable condition.   FOLLOW UP PLAN: Heel weightbearing as tolerated on the heel in a cam boot.  Follow-up in the office in 2  weeks for suture removal and initiation of heel toe weightbearing in the boot.   RADIOGRAPHS: AP and lateral radiographs of the left foot show interval arthrodesis of the hallux MP joint and correction of the bunion deformity.  Hardware is appropriately positioned and of the appropriate lengths.  No other acute injuries are noted.    Mechele Claude PA-C was present and scrubbed for the duration of the operative case. His assistance was essential in positioning the patient, prepping and draping, gaining and maintaining exposure, performing the operation, closing and dressing the wounds and applying the splint.

## 2020-07-02 NOTE — Progress Notes (Signed)
Assisted Dr. Daiva Huge with left, ultrasound guided, popliteal and adductor canal blocks. Side rails up, monitors on throughout procedure. See vital signs in flow sheet. Tolerated Procedure well.

## 2020-07-02 NOTE — Anesthesia Procedure Notes (Signed)
Procedure Name: LMA Insertion Date/Time: 07/02/2020 7:40 AM Performed by: Gwyndolyn Saxon, CRNA Pre-anesthesia Checklist: Patient identified, Emergency Drugs available, Suction available and Patient being monitored Patient Re-evaluated:Patient Re-evaluated prior to induction Oxygen Delivery Method: Circle system utilized Preoxygenation: Pre-oxygenation with 100% oxygen Induction Type: IV induction Ventilation: Mask ventilation without difficulty LMA: LMA inserted LMA Size: 4.0 Number of attempts: 1 Placement Confirmation: positive ETCO2 and breath sounds checked- equal and bilateral Tube secured with: Tape Dental Injury: Teeth and Oropharynx as per pre-operative assessment

## 2020-07-02 NOTE — Addendum Note (Signed)
Addendum  created 07/02/20 1011 by Gwyndolyn Saxon, CRNA   Charge Capture section accepted

## 2020-07-02 NOTE — Anesthesia Postprocedure Evaluation (Signed)
Anesthesia Post Note  Patient: MARIEELENA BARTKO  Procedure(s) Performed: Left hallux metatarsophalangeal joint arthrodesis (Left Foot)     Patient location during evaluation: PACU Anesthesia Type: General Level of consciousness: awake and alert and oriented Pain management: pain level controlled Vital Signs Assessment: post-procedure vital signs reviewed and stable Respiratory status: spontaneous breathing, nonlabored ventilation and respiratory function stable Cardiovascular status: blood pressure returned to baseline Postop Assessment: no apparent nausea or vomiting Anesthetic complications: no   No complications documented.  Last Vitals:  Vitals:   07/02/20 0845 07/02/20 0900  BP: (!) 93/45 (!) 89/41  Pulse: 70 65  Resp: 11 10  Temp:    SpO2: 100% 100%    Last Pain:  Vitals:   07/02/20 0915  TempSrc:   PainSc: 2     LLE Motor Response: No movement due to regional block (07/02/20 0915) LLE Sensation: Numbness (07/02/20 0915)          Brennan Bailey

## 2020-07-02 NOTE — Transfer of Care (Signed)
Immediate Anesthesia Transfer of Care Note  Patient: Shannon [name]  Procedure(s) Performed: Left hallux metatarsophalangeal joint arthrodesis (Left Foot)  Patient Location: PACU  Anesthesia Type:General and Regional  Level of Consciousness: drowsy and patient cooperative  Airway & Oxygen Therapy: Patient Spontanous Breathing and Patient connected to face mask oxygen  Post-op Assessment: Report given to RN and Post -op Vital signs reviewed and stable  Post vital signs: Reviewed and stable  Last Vitals:  Vitals Value Taken Time  BP 99/49 07/02/20 0837  Temp    Pulse 80 07/02/20 0839  Resp 17 07/02/20 0839  SpO2 100 % 07/02/20 0839  Vitals shown include unvalidated device data.  Last Pain:  Vitals:   07/02/20 0634  TempSrc: Oral  PainSc: 0-No pain      Patients Stated Pain Goal: 8 (63/33/54 5625)  Complications: No complications documented.

## 2020-07-02 NOTE — Anesthesia Procedure Notes (Signed)
Anesthesia Regional Block: Popliteal block   Pre-Anesthetic Checklist: ,, timeout performed, Correct Patient, Correct Site, Correct Laterality, Correct Procedure, Correct Position, site marked, Risks and benefits discussed, pre-op evaluation,  At surgeon's request and post-op pain management  Laterality: Left  Prep: Maximum Sterile Barrier Precautions used, chloraprep       Needles:  Injection technique: Single-shot  Needle Type: Echogenic Stimulator Needle     Needle Length: 9cm  Needle Gauge: 22     Additional Needles:   Procedures:,,,, ultrasound used (permanent image in chart),,,,  Narrative:  Start time: 07/02/2020 7:04 AM End time: 07/02/2020 7:06 AM Injection made incrementally with aspirations every 5 mL.  Performed by: Personally  Anesthesiologist: Brennan Bailey, MD  Additional Notes: Risks, benefits, and alternative discussed. Patient gave consent for procedure. Patient prepped and draped in sterile fashion. Sedation administered, patient remains easily responsive to voice. Relevant anatomy identified with ultrasound guidance. Local anesthetic given in 5cc increments with no signs or symptoms of intravascular injection. No pain or paraesthesias with injection. Patient monitored throughout procedure with signs of LAST or immediate complications. Tolerated well. Ultrasound image placed in chart.  Tawny Asal, MD

## 2020-07-03 ENCOUNTER — Encounter (HOSPITAL_BASED_OUTPATIENT_CLINIC_OR_DEPARTMENT_OTHER): Payer: Self-pay | Admitting: Orthopedic Surgery

## 2020-08-12 DIAGNOSIS — M2022 Hallux rigidus, left foot: Secondary | ICD-10-CM | POA: Diagnosis not present

## 2020-08-12 DIAGNOSIS — Z4889 Encounter for other specified surgical aftercare: Secondary | ICD-10-CM | POA: Diagnosis not present

## 2020-09-09 DIAGNOSIS — M25562 Pain in left knee: Secondary | ICD-10-CM | POA: Diagnosis not present

## 2020-09-11 DIAGNOSIS — Z4889 Encounter for other specified surgical aftercare: Secondary | ICD-10-CM | POA: Diagnosis not present

## 2020-09-11 DIAGNOSIS — M2022 Hallux rigidus, left foot: Secondary | ICD-10-CM | POA: Diagnosis not present

## 2020-09-16 DIAGNOSIS — R002 Palpitations: Secondary | ICD-10-CM | POA: Diagnosis not present

## 2020-09-21 DIAGNOSIS — R002 Palpitations: Secondary | ICD-10-CM | POA: Diagnosis not present

## 2020-10-06 DIAGNOSIS — R002 Palpitations: Secondary | ICD-10-CM | POA: Diagnosis not present

## 2020-10-07 DIAGNOSIS — R002 Palpitations: Secondary | ICD-10-CM | POA: Diagnosis not present

## 2020-10-13 DIAGNOSIS — R002 Palpitations: Secondary | ICD-10-CM | POA: Diagnosis not present

## 2020-10-13 IMAGING — MG DIGITAL SCREENING BILAT W/ TOMO W/ CAD
8 series · 8 of 24 positions shown · non-contrast
Comparison: Previous exam(s).

CLINICAL DATA: Screening.

EXAM:
DIGITAL SCREENING BILATERAL MAMMOGRAM WITH TOMO AND CAD

[R CC synth-2D]
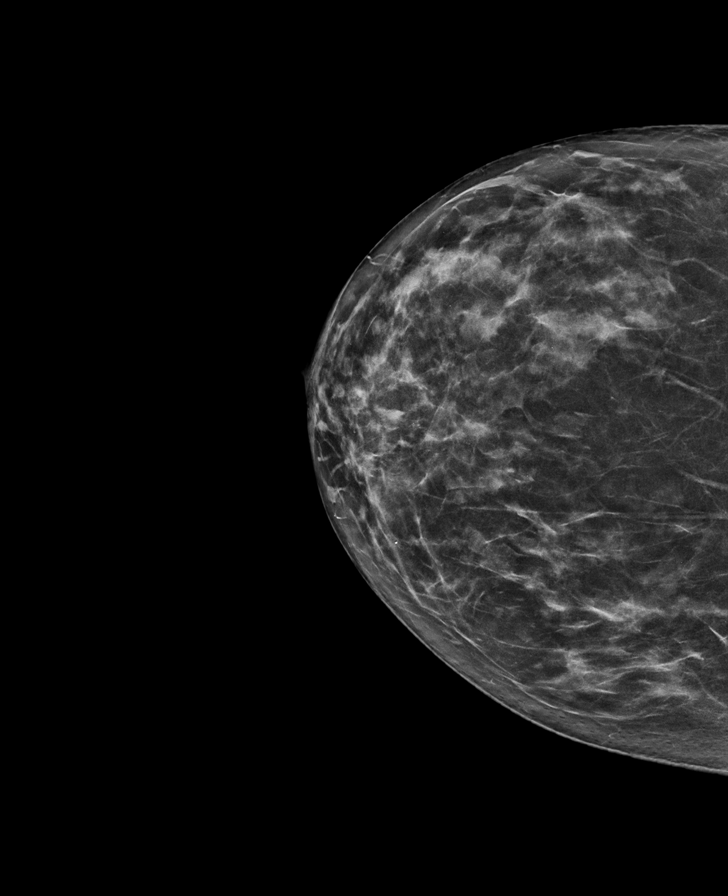

[L CC synth-2D]
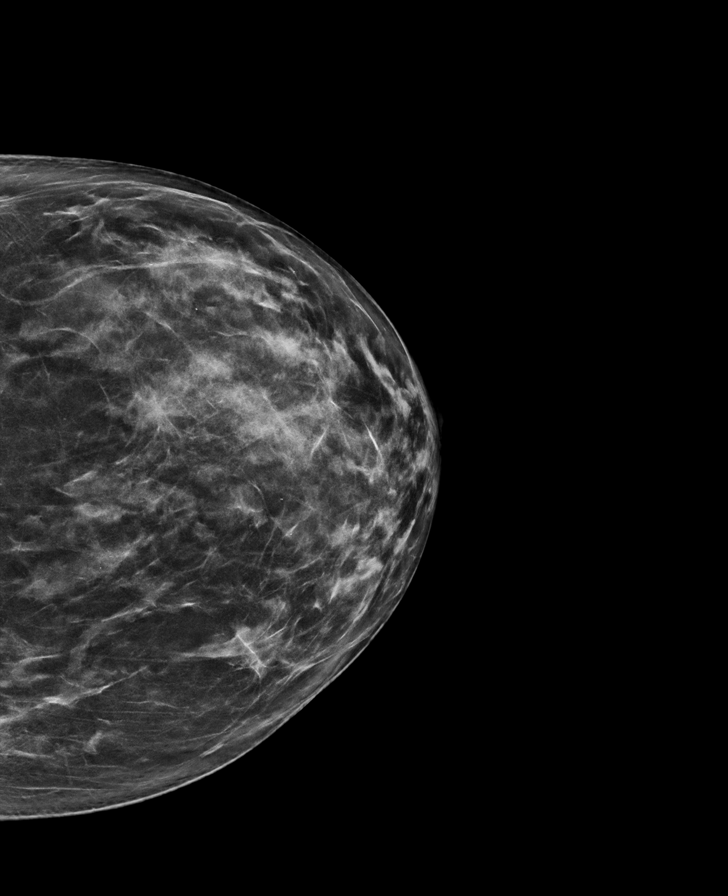

[R MLO synth-2D]
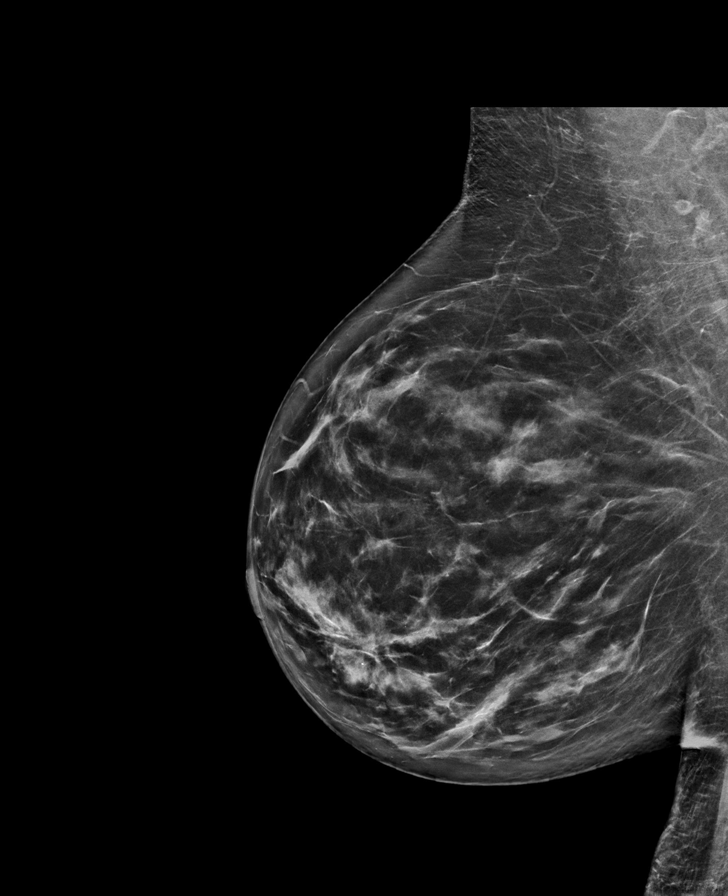

[L MLO synth-2D]
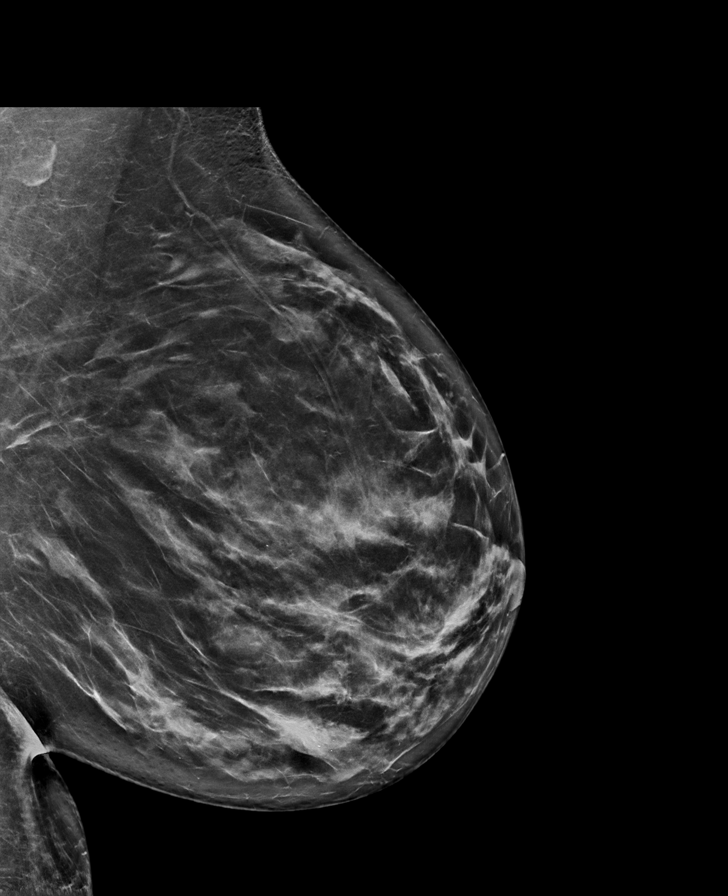

[L MLO tomo · tomo slice 39/77.0]
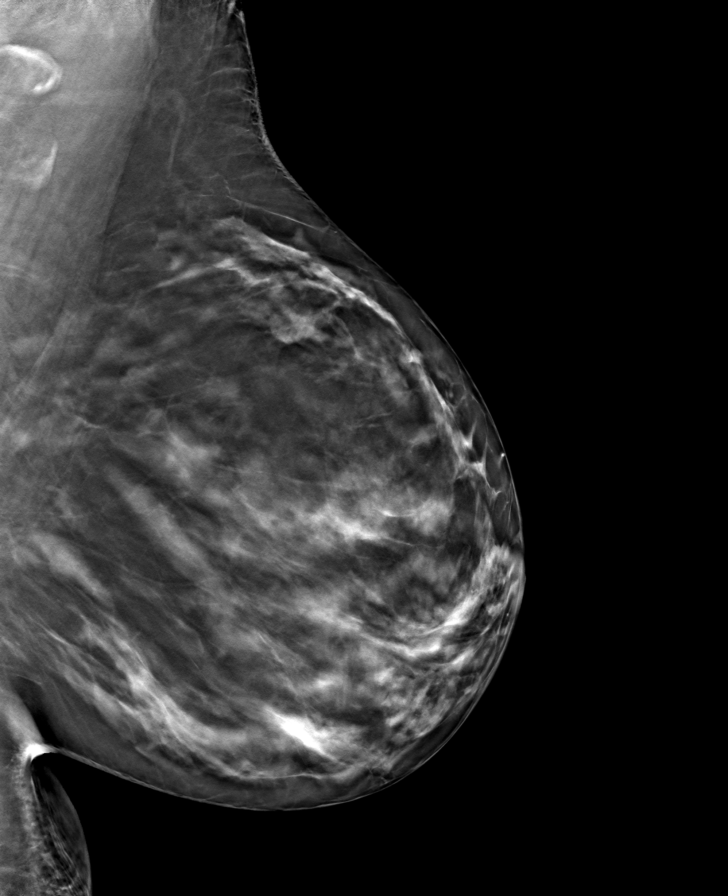

[R MLO tomo · tomo slice 39/76.0]
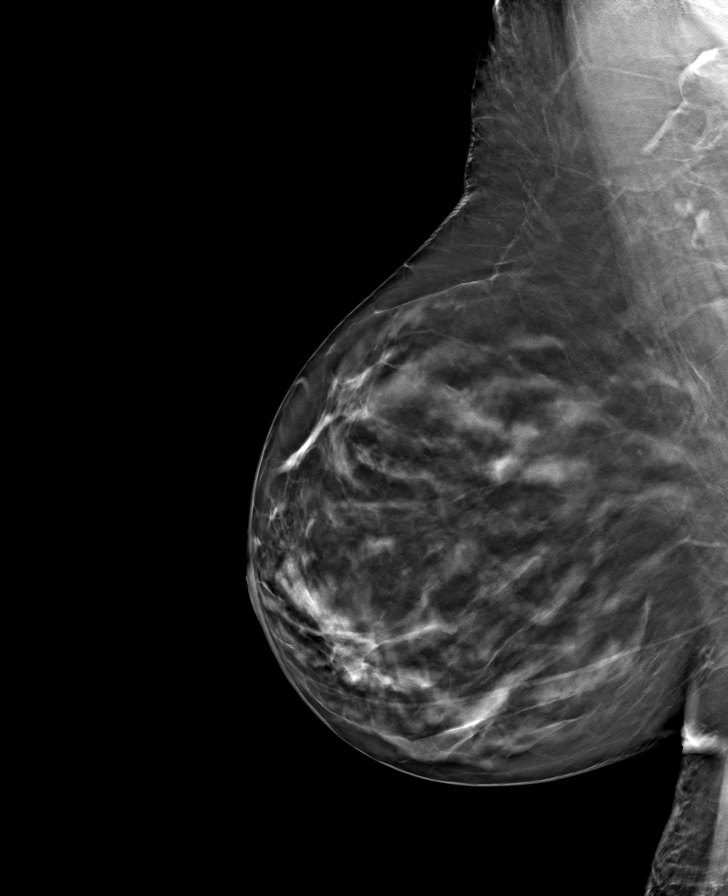

[L CC tomo · tomo slice 37/73.0]
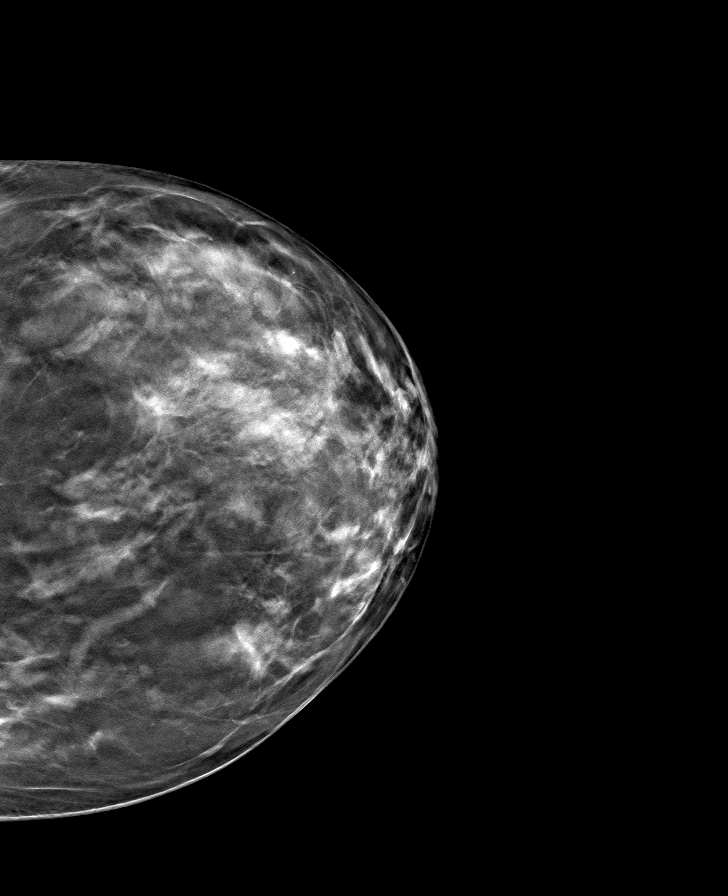

[R CC tomo · tomo slice 35/68.0]
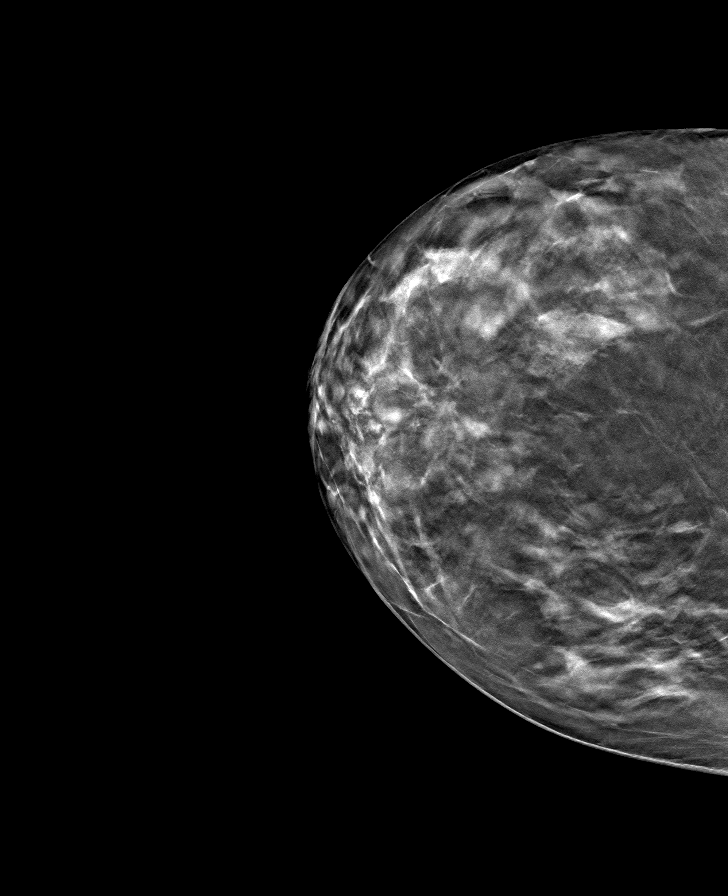

[8 of 24 positions shown; findings below may reference images not displayed]

ACR Breast Density Category c: The breast tissue is heterogeneously
dense, which may obscure small masses.
FINDINGS: There are no findings suspicious for malignancy. Images were
processed with CAD.
IMPRESSION: No mammographic evidence of malignancy. A result letter of this
screening mammogram will be mailed directly to the patient.

RECOMMENDATION:
Screening mammogram in one year. (Code:[id number])

BI-RADS CATEGORY  1: Negative.

## 2020-10-20 DIAGNOSIS — R002 Palpitations: Secondary | ICD-10-CM | POA: Diagnosis not present

## 2020-10-20 DIAGNOSIS — I351 Nonrheumatic aortic (valve) insufficiency: Secondary | ICD-10-CM | POA: Diagnosis not present

## 2020-10-20 DIAGNOSIS — Z658 Other specified problems related to psychosocial circumstances: Secondary | ICD-10-CM | POA: Diagnosis not present

## 2020-10-27 ENCOUNTER — Other Ambulatory Visit: Payer: Self-pay

## 2020-10-27 ENCOUNTER — Encounter (HOSPITAL_COMMUNITY): Payer: Self-pay | Admitting: *Deleted

## 2020-10-27 ENCOUNTER — Encounter: Payer: Self-pay | Admitting: Cardiovascular Disease

## 2020-10-27 ENCOUNTER — Ambulatory Visit: Payer: Medicare Other | Admitting: Cardiovascular Disease

## 2020-10-27 VITALS — BP 110/60 | HR 65 | Ht 64.0 in | Wt 137.0 lb

## 2020-10-27 DIAGNOSIS — I472 Ventricular tachycardia: Secondary | ICD-10-CM | POA: Diagnosis not present

## 2020-10-27 DIAGNOSIS — Z8249 Family history of ischemic heart disease and other diseases of the circulatory system: Secondary | ICD-10-CM

## 2020-10-27 DIAGNOSIS — I471 Supraventricular tachycardia, unspecified: Secondary | ICD-10-CM | POA: Insufficient documentation

## 2020-10-27 DIAGNOSIS — R072 Precordial pain: Secondary | ICD-10-CM | POA: Diagnosis not present

## 2020-10-27 DIAGNOSIS — E785 Hyperlipidemia, unspecified: Secondary | ICD-10-CM | POA: Insufficient documentation

## 2020-10-27 DIAGNOSIS — I10 Essential (primary) hypertension: Secondary | ICD-10-CM | POA: Insufficient documentation

## 2020-10-27 DIAGNOSIS — I351 Nonrheumatic aortic (valve) insufficiency: Secondary | ICD-10-CM | POA: Insufficient documentation

## 2020-10-27 DIAGNOSIS — I4729 Other ventricular tachycardia: Secondary | ICD-10-CM

## 2020-10-27 DIAGNOSIS — E782 Mixed hyperlipidemia: Secondary | ICD-10-CM

## 2020-10-27 MED ORDER — METOPROLOL TARTRATE 25 MG PO TABS
12.5000 mg | ORAL_TABLET | Freq: Two times a day (BID) | ORAL | 3 refills | Status: DC
Start: 2020-10-27 — End: 2020-10-27

## 2020-10-27 MED ORDER — VERAPAMIL HCL ER 120 MG PO TBCR: 120.0000 mg | EXTENDED_RELEASE_TABLET | Freq: Every day | ORAL | 3 refills | Status: DC

## 2020-10-27 MED ORDER — METOPROLOL TARTRATE 25 MG PO TABS: 12.5000 mg | ORAL_TABLET | Freq: Two times a day (BID) | ORAL | 3 refills | Status: DC

## 2020-10-27 NOTE — Addendum Note (Signed)
Addended by: Meryl Crutch on: 10/27/2020 11:28 AM   Modules accepted: Orders

## 2020-10-27 NOTE — Assessment & Plan Note (Signed)
History of hyperlipidemia on statin therapy lipid profile performed 04/16/2020 revealing total cholesterol 188, LDL 94 and HDL of 80.

## 2020-10-27 NOTE — Progress Notes (Signed)
10/27/2020 Shannon [name]   01/05/1946  [id number]  Primary Physician Shannon Huddle, MD Primary Cardiologist: Lorretta Harp MD Shannon [name], [place]  HPI:  Shannon [name] is a 75 y.o. thin appearing married Caucasian female mother of 1 son, grandmother and 1 grandchild who was referred by Dr. Mertha [name] for symptomatic tachypalpitations.  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  She never smoked.  There is a family history for heart disease with a father that had myocardial infarction.  She has treated hypertension hyperlipidemia.  She is never had a heart attack or stroke.  She denies chest pain or shortness of breath.  She has noticed the onset of nightly tachypalpitations began in the early a.m. hours and lasting on and off until she wakes up in the morning.  Recent Zio patch revealed runs of nonsustained ventricular tachycardia and SVT.  2D echo performed in December revealed normal LV systolic function with moderate AI unchanged from an echo performed in 2016.  She says during these episodes she does feel some fullness in her chest.  Lab work read by Dr. Huston [name] was apparently unremarkable.  Current Meds  Medication Sig   ALPRAZolam (XANAX) 0.25 MG tablet Take 0.125-0.25 mg by mouth at bedtime.    buPROPion (WELLBUTRIN XL) 150 MG 24 hr tablet 1 tablet in the morning   Cholecalciferol (VITAMIN D3) 50 MCG (2000 UT) TABS Take 2,000 mg by mouth daily.   ibuprofen (ADVIL) 400 MG tablet Take 400 mg by mouth every 6 (six) hours as needed.   losartan-hydrochlorothiazide (HYZAAR) 100-12.5 MG tablet Take 1 tablet by mouth daily. Patient states she takes 1/2 tablet now   rosuvastatin (CRESTOR) 20 MG tablet Take 20 mg by mouth every other day.   vitamin B-12 (CYANOCOBALAMIN) 1000 MCG tablet Take 1,000 mcg by mouth daily.   [DISCONTINUED] verapamil (CALAN-SR) 240 MG CR tablet Take 240 mg by mouth daily.     Allergies  Allergen Reactions    Cephalexin Hives    Pt received ancef 07/02/2020 and did well   Morphine Other (See Comments) and Palpitations   Penicillins Hives    Pt received ancef on 07/02/2020 with no reaction   Norco [Hydrocodone-Acetaminophen] Hives   Oxycodone Hives    Social History   Socioeconomic History   Marital status: Married    Spouse name: Not on file   Number of children: Not on file   Years of education: Not on file   Highest education level: Not on file  Occupational History   Not on file  Tobacco Use   Smoking status: Never   Smokeless tobacco: Never  Vaping Use   Vaping Use: Never used  Substance and Sexual Activity   Alcohol use: Yes    Comment: occasional   Drug use: Never   Sexual activity: Not on file  Other Topics Concern   Not on file  Social History Narrative   Not on file   Social Determinants of Health   Financial Resource Strain: Not on file  Food Insecurity: Not on file  Transportation Needs: Not on file  Physical Activity: Not on file  Stress: Not on file  Social Connections: Not on file  Intimate Partner Violence: Not on file     Review of Systems: General: negative for chills, fever, night sweats or weight changes.  Cardiovascular: negative for chest pain, dyspnea on exertion, edema, orthopnea, palpitations, paroxysmal nocturnal dyspnea or shortness of  breath Dermatological: negative for rash Respiratory: negative for cough or wheezing Urologic: negative for hematuria Abdominal: negative for nausea, vomiting, diarrhea, bright red blood per rectum, melena, or hematemesis Neurologic: negative for visual changes, syncope, or dizziness All other systems reviewed and are otherwise negative except as noted above.    Blood pressure 110/60, pulse 65, height 5\' 4"  (1.626 m), weight 137 lb (62.1 kg).  General appearance: alert and no distress Neck: no adenopathy, no carotid bruit, no JVD, supple, symmetrical, trachea midline, and thyroid not enlarged, symmetric, no  tenderness/mass/nodules Lungs: clear to auscultation bilaterally Heart: regular rate and rhythm, S1, S2 normal, no murmur, click, rub or gallop Extremities: extremities normal, atraumatic, no cyanosis or edema Pulses: 2+ and symmetric Skin: Skin color, texture, turgor normal. No rashes or lesions Neurologic: Grossly normal  EKG sinus rhythm at 65 without ST or T wave changes.  Personally reviewed this EKG.  ASSESSMENT AND PLAN:   Nonsustained ventricular tachycardia [facility]) Shannon [name] was referred by Dr. Inda [name] for new onset tachypalpitations primarily in the early a.m. hours.  A Zio patch revealed runs of nonsustained ventricular tachycardia and PSVT.  She was on verapamil and Dr. Inda [name] increased her dose with little effect.  A 2D echo was essentially normal except for moderate AI.  I am going to decrease her verapamil dose, add low-dose beta-blocker, obtain a Lexiscan Myoview stress test and a coronary calcium score to further evaluate.  Hyperlipidemia History of hyperlipidemia on statin therapy lipid profile performed 04/16/2020 revealing total cholesterol 188, LDL 94 and HDL of 80.  Essential hypertension History of essential hypertension a blood pressure measured today at 110/60.  She is on verapamil, losartan and hydrochlorothiazide.  I am going to decrease her verapamil from 240 to 120 mg a day .  I suspect that adding low-dose beta-blocker for her nonsustained ventricular tachycardia will offset the lowering of her verapamil dose.     Lorretta Harp MD FACP,FACC,FAHA, [facility] 10/27/2020 11:09 AM

## 2020-10-27 NOTE — Assessment & Plan Note (Signed)
Ms. Shannon [name] was referred by Dr. Inda Merlin for new onset tachypalpitations primarily in the early a.m. hours.  A Zio patch revealed runs of nonsustained ventricular tachycardia and PSVT.  She was on verapamil and Dr. Inda Merlin increased her dose with little effect.  A 2D echo was essentially normal except for moderate AI.  I am going to decrease her verapamil dose, add low-dose beta-blocker, obtain a Lexiscan Myoview stress test and a coronary calcium score to further evaluate.

## 2020-10-27 NOTE — Assessment & Plan Note (Signed)
Recent 2D echo performed at her PCP 04/29/2020 revealed normal LV systolic function, grade 1 diastolic dysfunction with moderate AI.  This will need to be repeated on an annual basis.

## 2020-10-27 NOTE — Patient Instructions (Signed)
Medication Instructions:  Decrease Verapamil to 120 mg daily Start Metoprolol 12.5 mg twice a day  *If you need a refill on your cardiac medications before your next appointment, please call your pharmacy*   Lab Work: None ordered today   Testing/Procedures: Your physician has requested that you have a lexiscan myoview. For further information please visit [email or web address]. Please follow instruction sheet, as given.  [facility] [address]  Dr. Gwenlyn Found has ordered a CT coronary calcium score. This test is done at [address]. This is $99 out of pocket.   Coronary CalciumScan A coronary calcium scan is an imaging test used to look for deposits of calcium and other fatty materials (plaques) in the inner lining of the blood vessels of the heart (coronary arteries). These deposits of calcium and plaques can partly clog and narrow the coronary arteries without producing any symptoms or warning signs. This puts a person at risk for a heart attack. This test can detect these deposits before symptoms develop. Tell a health care provider about: Any allergies you have. All medicines you are taking, including vitamins, herbs, eye drops, creams, and over-the-counter medicines. Any problems you or family members have had with anesthetic medicines. Any blood disorders you have. Any surgeries you have had. Any medical conditions you have. Whether you are pregnant or may be pregnant. What are the risks? Generally, this is a safe procedure. However, problems may occur, including: Harm to a pregnant woman and her unborn baby. This test involves the use of radiation. Radiation exposure can be dangerous to a pregnant woman and her unborn baby. If you are pregnant, you generally should not have this procedure done. Slight increase in the risk of cancer. This is because of the radiation involved in the test. What happens before the procedure? No preparation is needed for this  procedure. What happens during the procedure? You will undress and remove any jewelry around your neck or chest. You will put on a hospital gown. Sticky electrodes will be placed on your chest. The electrodes will be connected to an electrocardiogram (ECG) machine to record a tracing of the electrical activity of your heart. A CT scanner will take pictures of your heart. During this time, you will be asked to lie still and hold your breath for 2-3 seconds while a picture of your heart is being taken. The procedure may vary among health care providers and hospitals. What happens after the procedure? You can get dressed. You can return to your normal activities. It is up to you to get the results of your test. Ask your health care provider, or the department that is doing the test, when your results will be ready. Summary A coronary calcium scan is an imaging test used to look for deposits of calcium and other fatty materials (plaques) in the inner lining of the blood vessels of the heart (coronary arteries). Generally, this is a safe procedure. Tell your health care provider if you are pregnant or may be pregnant. No preparation is needed for this procedure. A CT scanner will take pictures of your heart. You can return to your normal activities after the scan is done. This information is not intended to replace advice given to you by your health care provider. Make sure you discuss any questions you have with your health care provider. Document Released: 10/15/2007 Document Revised: 03/07/2016 Document Reviewed: 03/07/2016 Elsevier Interactive Patient Education  2017 [employer].   Follow-Up: At [facility], you  and your health needs are our priority.  As part of our continuing mission to provide you with exceptional heart care, we have created designated Provider Care Teams.  These Care Teams include your primary Cardiologist (physician) and Advanced Practice Providers (APPs -  Physician  Assistants and Nurse Practitioners) who all work together to provide you with the care you need, when you need it.  We recommend signing up for the patient portal called "MyChart".  Sign up information is provided on this After Visit Summary.  MyChart is used to connect with patients for Virtual Visits (Telemedicine).  Patients are able to view lab/test results, encounter notes, upcoming appointments, etc.  Non-urgent messages can be sent to your provider as well.   To learn more about what you can do with MyChart, go to [email or web address].    Your next appointment:   3-4 week(s)  The format for your next appointment:   In Person  Provider:   Quay Burow, MD

## 2020-10-27 NOTE — Assessment & Plan Note (Signed)
History of essential hypertension a blood pressure measured today at 110/60.  She is on verapamil, losartan and hydrochlorothiazide.  I am going to decrease her verapamil from 240 to 120 mg a day .  I suspect that adding low-dose beta-blocker for her nonsustained ventricular tachycardia will offset the lowering of her verapamil dose.

## 2020-10-28 ENCOUNTER — Ambulatory Visit (HOSPITAL_COMMUNITY): Payer: Medicare Other | Attending: Cardiovascular Disease

## 2020-10-28 DIAGNOSIS — R072 Precordial pain: Secondary | ICD-10-CM | POA: Diagnosis not present

## 2020-10-28 LAB — MYOCARDIAL PERFUSION IMAGING
LV dias vol: 67 mL (ref 46–106)
LV sys vol: 22 mL
Peak HR: 85 {beats}/min
Rest HR: 66 {beats}/min
SDS: 2
SRS: 1
SSS: 3
TID: 1.07

## 2020-10-28 MED ORDER — REGADENOSON 0.4 MG/5ML IV SOLN
0.4000 mg | Freq: Once | INTRAVENOUS | Status: AC
  Administered 2020-10-28: 0.4 mg via INTRAVENOUS

## 2020-10-28 MED ORDER — TECHNETIUM TC 99M TETROFOSMIN IV KIT
10.8000 | PACK | Freq: Once | INTRAVENOUS | Status: AC | PRN
  Administered 2020-10-28: 10.8 via INTRAVENOUS
  Filled 2020-10-28: qty 11

## 2020-10-28 MED ORDER — TECHNETIUM TC 99M TETROFOSMIN IV KIT
30.7000 | PACK | Freq: Once | INTRAVENOUS | Status: AC | PRN
  Filled 2020-10-28: qty 31

## 2020-10-28 MED ORDER — TECHNETIUM TC 99M TETROFOSMIN IV KIT
30.7000 | PACK | Freq: Once | INTRAVENOUS | Status: AC | PRN
  Administered 2020-10-28: 30.7 via INTRAVENOUS
  Filled 2020-10-28: qty 31

## 2020-10-30 ENCOUNTER — Other Ambulatory Visit: Payer: Self-pay

## 2020-10-30 ENCOUNTER — Ambulatory Visit: Payer: Medicare Other | Admitting: Cardiovascular Disease

## 2020-11-18 ENCOUNTER — Other Ambulatory Visit: Payer: Self-pay

## 2020-11-18 ENCOUNTER — Encounter: Payer: Self-pay | Admitting: Cardiovascular Disease

## 2020-11-18 ENCOUNTER — Ambulatory Visit (INDEPENDENT_AMBULATORY_CARE_PROVIDER_SITE_OTHER): Payer: Medicare Other | Admitting: Cardiovascular Disease

## 2020-11-18 VITALS — BP 132/70 | HR 89 | Ht 64.0 in | Wt 138.2 lb

## 2020-11-18 DIAGNOSIS — I471 Supraventricular tachycardia: Secondary | ICD-10-CM

## 2020-11-18 DIAGNOSIS — I472 Ventricular tachycardia: Secondary | ICD-10-CM

## 2020-11-18 DIAGNOSIS — I4729 Other ventricular tachycardia: Secondary | ICD-10-CM

## 2020-11-18 NOTE — Patient Instructions (Signed)
Medication Instructions:  The current medical regimen is effective;  continue present plan and medications.  *If you need a refill on your cardiac medications before your next appointment, please call your pharmacy*   Follow-Up: At [facility], you and your health needs are our priority.  As part of our continuing mission to provide you with exceptional heart care, we have created designated Provider Care Teams.  These Care Teams include your primary Cardiologist (physician) and Advanced Practice Providers (APPs -  Physician Assistants and Nurse Practitioners) who all work together to provide you with the care you need, when you need it.  We recommend signing up for the patient portal called "MyChart".  Sign up information is provided on this After Visit Summary.  MyChart is used to connect with patients for Virtual Visits (Telemedicine).  Patients are able to view lab/test results, encounter notes, upcoming appointments, etc.  Non-urgent messages can be sent to your provider as well.   To learn more about what you can do with MyChart, go to [email or web address].    Your next appointment:   3 month(s)  The format for your next appointment:   In Person  Provider:   Quay Burow, MD   Other Instructions Referral to EP (they will call you to set up an appointment)

## 2020-11-18 NOTE — Progress Notes (Signed)
Ms. Hiers returns today for follow-up.  Her Myoview stress test was nonischemic.  I did cut her verapamil in half and added metoprolol.  She is still symptomatic with multiple episodes of nocturnal tachypalpitations which awaken her at night.  She has normal LV function.  She did tell me that all of these symptoms began shortly after her second COVID booster 08/10/2020 which may be causal.  I am referring her to EP for further evaluation.  Lorretta Harp, M.D., [place], [facility], [place]verta [place]. [place], [place]  [address]  [phone] 11/18/2020 11:58 AM

## 2020-11-24 ENCOUNTER — Ambulatory Visit (INDEPENDENT_AMBULATORY_CARE_PROVIDER_SITE_OTHER)
Admission: RE | Admit: 2020-11-24 | Discharge: 2020-11-24 | Disposition: A | Discharge status: Home / Self Care | Payer: Self-pay | Source: Ambulatory Visit | Attending: Cardiovascular Disease | Admitting: Cardiovascular Disease

## 2020-11-24 ENCOUNTER — Other Ambulatory Visit: Payer: Self-pay

## 2020-11-24 DIAGNOSIS — I10 Essential (primary) hypertension: Secondary | ICD-10-CM

## 2020-11-25 ENCOUNTER — Telehealth: Payer: Self-pay | Admitting: *Deleted

## 2020-11-25 DIAGNOSIS — E782 Mixed hyperlipidemia: Secondary | ICD-10-CM

## 2020-11-25 NOTE — Telephone Encounter (Signed)
-----   Message from Lorretta Harp, MD sent at 11/25/2020  7:09 AM EDT ----- CCS 12 (low but not 0) with LDL 94 in Dec 2021 on crestor 20. Goal is LDL <70. Re check FLP

## 2020-11-25 NOTE — Telephone Encounter (Signed)
Spoke with pt, aware of results. Paperwork placed at the front desk for patient pick up for labs per her request.

## 2020-11-26 ENCOUNTER — Ambulatory Visit: Payer: Medicare Other | Admitting: Internal Medicine

## 2020-11-26 ENCOUNTER — Other Ambulatory Visit: Payer: Self-pay

## 2020-11-26 ENCOUNTER — Encounter: Payer: Self-pay | Admitting: Internal Medicine

## 2020-11-26 VITALS — BP 110/60 | HR 59 | Ht 64.0 in | Wt 137.2 lb

## 2020-11-26 DIAGNOSIS — E782 Mixed hyperlipidemia: Secondary | ICD-10-CM | POA: Diagnosis not present

## 2020-11-26 DIAGNOSIS — I471 Supraventricular tachycardia, unspecified: Secondary | ICD-10-CM

## 2020-11-26 DIAGNOSIS — R002 Palpitations: Secondary | ICD-10-CM

## 2020-11-26 LAB — LIPID PANEL
Chol/HDL Ratio: 2.4 ratio (ref 0.0–4.4)
Cholesterol, Total: 183 mg/dL (ref 100–199)
HDL: 76 mg/dL (ref >39)
LDL Chol Calc (NIH): 89 mg/dL (ref 0–99)
Triglycerides: 100 mg/dL (ref 0–149)
VLDL Cholesterol Cal: 18 mg/dL (ref 5–40)

## 2020-11-26 MED ORDER — FLECAINIDE ACETATE 50 MG PO TABS: 50.0000 mg | ORAL_TABLET | Freq: Every evening | ORAL | 3 refills | Status: DC

## 2020-11-26 NOTE — Progress Notes (Signed)
Electrophysiology Office Note   Date:  11/26/2020   ID:  Aaris, Castetter [birth year], MRN [id number]  PCP:  Josetta Huddle, MD  Cardiologist:  Dr Gwenlyn Found Primary Electrophysiologist: Thompson Grayer, MD    CC: palpitations    History of Present Illness: Shannon [name] is a 75 y.o. female who presents today for electrophysiology evaluation.   The patient is referred by Dr Gwenlyn Found for EP consultation regarding palpitations.  She is healthy and active.  She has noticed nocturnal palpitations over the past 3 months.  She reports brief (several seconds) of palpitations occurring most evenings waking her from sleep.  She thinks that this occurs around 1 am.  She does not have them during the day.  She has worn a Zio monitor which captured nonsustained atrial tachycardia (typically 5-6 beats) as the cause.  She also has rare NSVT.  Workup with echo, cardiac CT has been unrevealing.  She thinks that her palpitations began several weeks after receiving her COVID booster.  Today, she denies symptoms of  chest pain, shortness of breath, orthopnea, PND, lower extremity edema, claudication, dizziness, presyncope, syncope, bleeding, or neurologic sequela. The patient is tolerating medications without difficulties and is otherwise without complaint today.    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      Cholecalciferol (VITAMIN D3) 50 MCG (2000 UT) TABS Take 2,000 mg by mouth daily.     flecainide (TAMBOCOR) 50 MG tablet Take 1 tablet (50 mg total) by mouth every evening. 180 tablet 3   ibuprofen (ADVIL) 400 MG tablet Take 400 mg by mouth every 6 (six) hours as needed.     losartan-hydrochlorothiazide (HYZAAR) 100-12.5 MG tablet Take 1 tablet by mouth daily. Patient states she takes 1/2 tablet now     rosuvastatin (CRESTOR) 20 MG tablet Take 20 mg by mouth every other day.     verapamil (CALAN-SR) 120 MG CR tablet Take 1 tablet (120 mg total) by mouth at bedtime. 90 tablet 3   vitamin B-12 (CYANOCOBALAMIN) 1000 MCG tablet Take 1,000 mcg by mouth daily.     No current facility-administered medications for this visit.   Facility-Administered Medications Ordered in Other Visits  Medication Dose Route Frequency Provider Last Rate Last Admin   technetium tetrofosmin (TC-MYOVIEW) injection 99991111 millicurie  99991111 millicurie Intravenous Once PRN O'Neal, Cassie Freer, MD        Allergies:   Cephalexin, Morphine, Penicillins, Norco [hydrocodone-acetaminophen], and Oxycodone   Social History:  The patient  reports that she has never smoked. She has never used  smokeless tobacco. She reports current alcohol use. She reports that she does not use drugs.   Family History:  The patient's  family history includes Heart attack in her father.    ROS:  Please see the history of present illness.   All other systems are personally reviewed and negative.    PHYSICAL EXAM: VS:  BP 110/60   Pulse (!) 59   Ht '5\' 4"'$  (1.626 m)   Wt 137 lb 3.2 oz (62.2 kg)   SpO2 99%   BMI 23.55 kg/m  , BMI Body mass index is 23.55 kg/m. GEN: Well nourished, well developed, in  no acute distress HEENT: normal Neck: no JVD, carotid bruits, or masses Cardiac: RRR; no murmurs, rubs, or gallops,no edema  Respiratory:  clear to auscultation bilaterally, normal work of breathing GI: soft, nontender, nondistended, + BS MS: no deformity or atrophy Skin: warm and dry  Neuro:  Strength and sensation are intact Psych: euthymic mood, full affect  EKG:  EKG is ordered today. The ekg ordered today is personally reviewed and shows sinus   Recent Labs: 06/29/2020: BUN 11; Creatinine, Ser 0.74; Potassium 4.4; Sodium 136  personally reviewed   Lipid Panel  No results found for: CHOL, TRIG, HDL, CHOLHDL, VLDL, LDLCALC, LDLDIRECT personally reviewed   Wt Readings from Last 3 Encounters:  11/26/20 137 lb 3.2 oz (62.2 kg)  11/18/20 138 lb 3.2 oz (62.7 kg)  10/28/20 137 lb (62.1 kg)     Other studies personally reviewed: Additional studies/ records that were reviewed today include: prior echo, cardiac CT, Zio  Review of the above records today demonstrates: as above   ASSESSMENT AND PLAN:  1.  Nonsustained atach Likely benign but symptomatic We discussed at length today We will stop metoprolol  as this has not been beneficial thus far and has caused some headaches.  I will start flecainide '50mg'$  qpm. Return in 4-6 weeks for further evaluation.  We could titrate flecainide up to '100mg'$  BID if needed.  Risks, benefits and potential toxicities for medications prescribed and/or refilled reviewed with patient today.    Army Fossa, MD  11/26/2020 11:32 AM     [place] [address] [phone] (office) [phone] (fax)

## 2020-11-26 NOTE — Patient Instructions (Addendum)
Medication Instructions:  Stop Metoprolol Tartrate Start Flecainide 50 mg every evening  Your physician recommends that you continue on your current medications as directed. Please refer to the Current Medication list given to you today.  Labwork: None ordered.  Testing/Procedures: None ordered.  Follow-Up: Your physician wants you to follow-up in: 12/29/20 at 10:20 am Legrand Como "Jonni Sanger" Chalmers Cater, PA-C    Any Other Special Instructions Will Be Listed Below (If Applicable).  If you need a refill on your cardiac medications before your next appointment, please call your pharmacy.

## 2020-12-09 ENCOUNTER — Telehealth: Payer: Self-pay | Admitting: *Deleted

## 2020-12-09 DIAGNOSIS — Z8249 Family history of ischemic heart disease and other diseases of the circulatory system: Secondary | ICD-10-CM

## 2020-12-09 DIAGNOSIS — E782 Mixed hyperlipidemia: Secondary | ICD-10-CM

## 2020-12-09 MED ORDER — ROSUVASTATIN CALCIUM 20 MG PO TABS: 20.0000 mg | ORAL_TABLET | Freq: Every day | ORAL | 3 refills | Status: DC

## 2020-12-09 NOTE — Telephone Encounter (Addendum)
-----   Message from Lorretta Harp, MD sent at 11/29/2020 11:42 AM EDT ----- CCS 12 with LDL 89 on Crestor 20 mg/day. Would increase to 40 my/day  (goal <70) and re check FLP 3 months  Left message for pt to call

## 2020-12-09 NOTE — Telephone Encounter (Signed)
Spoke with pt, aware of the recommendations. She is only taking crestor 3 x weekly. She agrees to increase to once daily. New script sent to the pharmacy  Lab orders mailed to the pt   While on the phone the patient asked about her flecainide dosage. She reports she started taking 50 mg once daily at bedtime on 11/27/20. She reports there has been no change in her symptoms. She has a follow up appointment on 12/29/20 but is concerned with waiting that long before changes are made in the medication. Aware will forward her concerns to dr allred to review and advise.

## 2020-12-11 ENCOUNTER — Telehealth: Payer: Self-pay | Admitting: Internal Medicine

## 2020-12-11 MED ORDER — FLECAINIDE ACETATE 100 MG PO TABS: 100.0000 mg | ORAL_TABLET | Freq: Two times a day (BID) | ORAL | 3 refills | Status: DC

## 2020-12-11 NOTE — Telephone Encounter (Signed)
Pt c/o medication issue:  1. Name of Medication:  flecainide (TAMBOCOR) 50 MG tablet  2. How are you currently taking this medication (dosage and times per day)? As prescribed   3. Are you having a reaction (difficulty breathing--STAT)? No   4. What is your medication issue? States she does not believe it is working and palps are getting worse   Patient c/o Palpitations:  High priority if patient c/o lightheadedness, shortness of breath, or chest pain  How long have you had palpitations/irregular HR/ Afib? Are you having the symptoms now? States they have been worse past two weeks   Are you currently experiencing lightheadedness, SOB or CP? No   Do you have a history of afib (atrial fibrillation) or irregular heart rhythm? Yes   Have you checked your BP or HR? (document readings if available): No   Are you experiencing any other symptoms? No   Only getting 3 1/2 hrs of sleep at night due to palps being worse and more frequent

## 2020-12-11 NOTE — Telephone Encounter (Signed)
After discussion with MD Allred, will titrate flecainide up to '100mg'$  BID. Notified the patient. Advised to call with continued symptoms if needed and follow up as planned.  Verbalized agreement and understanding.

## 2020-12-11 NOTE — Telephone Encounter (Signed)
See telephone note from today.

## 2020-12-22 ENCOUNTER — Telehealth: Payer: Self-pay | Admitting: Student

## 2020-12-22 NOTE — Telephone Encounter (Signed)
Patient wanted to move up to see Oda Kilts. Moved up to tomorrow, 12/23/20 at 8:40 am.

## 2020-12-23 ENCOUNTER — Ambulatory Visit: Payer: Medicare Other | Admitting: Cardiovascular Disease

## 2020-12-23 NOTE — Progress Notes (Signed)
PCP:  Josetta Huddle, MD Primary Cardiologist: None Electrophysiologist: Thompson Grayer, MD   Shannon [name] is a 75 y.o. female seen today for Thompson Grayer, MD for routine electrophysiology followup.    At last visit started on flecainide 50 mg BID. She felt this was not effective and even felt like she was having more nighttime palpitations. Increased to 100 mg BID by Dr. Rayann Heman.   Since last being seen in our clinic the patient reports doing about the same. Her palpitations do not bother her during the day, at all. They only occur at night as tachy-palpitations that last for 1-1.5 minutes. No evening caffeine. Avoiding alcohol. Reports med compliance. Again states this all started about 4 months ago after COVID booster.  she denies chest pain, dyspnea, PND, orthopnea, nausea, vomiting, dizziness, syncope, edema, weight gain, or early satiety.  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      Cholecalciferol (VITAMIN D3) 50 MCG (2000 UT) TABS Take 2,000 mg by mouth daily.     flecainide (TAMBOCOR) 100 MG tablet Take 1 tablet (100 mg total) by mouth 2 (two)  times daily. 180 tablet 3   ibuprofen (ADVIL) 400 MG tablet Take 400 mg by mouth every 6 (six) hours as needed.     losartan-hydrochlorothiazide (HYZAAR) 100-12.5 MG tablet Take 1 tablet by mouth daily. Patient states she takes 1/2 tablet now     rosuvastatin (CRESTOR) 20 MG tablet Take 1 tablet (20 mg total) by mouth daily. 90 tablet 3   verapamil (CALAN-SR) 120 MG CR tablet Take 1 tablet (120 mg total) by mouth at bedtime. 90 tablet 3   vitamin B-12 (CYANOCOBALAMIN) 1000 MCG tablet Take 1,000 mcg by mouth daily.     No current facility-administered medications for this visit.   Facility-Administered Medications Ordered in Other Visits  Medication Dose Route Frequency Provider Last Rate Last Admin   technetium tetrofosmin (TC-MYOVIEW) injection 99991111 millicurie  99991111 millicurie Intravenous Once PRN O'Neal, Cassie Freer, MD        Allergies  Allergen Reactions   Cephalexin Hives    Pt received ancef 07/02/2020 and did well   Morphine Other (See Comments) and Palpitations   Penicillins Hives    Pt received ancef on 07/02/2020 with no reaction   Norco [Hydrocodone-Acetaminophen] Hives   Oxycodone Hives    Social History   Socioeconomic History   Marital status: Married    Spouse name: Not on file   Number of children: Not on file   Years of education: Not on file   Highest education  level: Not on file  Occupational History   Not on file  Tobacco Use   Smoking status: Never   Smokeless tobacco: Never  Vaping Use   Vaping Use: Never used  Substance and Sexual Activity   Alcohol use: Yes    Comment: occasional   Drug use: Never   Sexual activity: Not on file  Other Topics Concern   Not on file  Social History Narrative   Not on file   Social Determinants of Health   Financial Resource Strain: Not on file  Food Insecurity: Not on file  Transportation Needs: Not on file  Physical Activity: Not on file  Stress: Not on file  Social Connections: Not on file  Intimate  Partner Violence: Not on file   Review of Systems: All other systems reviewed and are otherwise negative except as noted above.  Physical Exam: Vitals:   12/24/20 0839  BP: 120/60  Pulse: 69  SpO2: 98%  Weight: 137 lb 9.6 oz (62.4 kg)  Height: '5\' 4"'$  (1.626 m)    GEN- The patient is well appearing, alert and oriented x 3 today.   HEENT: normocephalic, atraumatic; sclera clear, conjunctiva pink; hearing intact; oropharynx clear; neck supple, no JVP Lymph- no cervical lymphadenopathy Lungs- Clear to ausculation bilaterally, normal work of breathing.  No wheezes, rales, rhonchi Heart- Regular rate and rhythm, no murmurs, rubs or gallops, PMI not laterally displaced GI- soft, non-tender, non-distended, bowel sounds present, no hepatosplenomegaly Extremities- no clubbing, cyanosis, or edema; DP/PT/radial pulses 2+ bilaterally MS- no significant deformity or atrophy Skin- warm and dry, no rash or lesion Psych- euthymic mood, full affect Neuro- strength and sensation are intact  EKG is ordered. Personal review of EKG from today shows NSR at 69 bpm with slightly prolonged PR interval compared to previous at 212 ms (Prior 150-160 ms range)  Additional studies reviewed include: Previous EP office notes.   Assessment and Plan:  1. Non-sustained ATRIAL tachycardia Felt to be overall benign, but patient is unfortunately quiet symptomatic in the evenings.  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   RTC 4 weeks to re-assess. Pt knows to call with worsening symptoms.   Shirley Friar, PA-C  12/24/20 8:42 AM

## 2020-12-24 ENCOUNTER — Other Ambulatory Visit: Payer: Self-pay

## 2020-12-24 ENCOUNTER — Encounter: Payer: Self-pay | Admitting: Student

## 2020-12-24 ENCOUNTER — Ambulatory Visit: Payer: Medicare Other | Admitting: Student

## 2020-12-24 VITALS — BP 120/60 | HR 69 | Ht 64.0 in | Wt 137.6 lb

## 2020-12-24 DIAGNOSIS — R002 Palpitations: Secondary | ICD-10-CM

## 2020-12-24 DIAGNOSIS — I471 Supraventricular tachycardia: Secondary | ICD-10-CM | POA: Diagnosis not present

## 2020-12-24 LAB — CBC
Hematocrit: 40.1 % (ref 34.0–46.6)
Hemoglobin: 13.7 g/dL (ref 11.1–15.9)
MCH: 31.1 pg (ref 26.6–33.0)
MCHC: 34.2 g/dL (ref 31.5–35.7)
MCV: 91 fL (ref 79–97)
Platelets: 254 10*3/uL (ref 150–450)
RBC: 4.4 x10E6/uL (ref 3.77–5.28)
RDW: 12.1 % (ref 11.7–15.4)
WBC: 6.9 10*3/uL (ref 3.4–10.8)

## 2020-12-24 LAB — BASIC METABOLIC PANEL
BUN/Creatinine Ratio: 19 (ref 12–28)
BUN: 14 mg/dL (ref 8–27)
CO2: 24 mmol/L (ref 20–29)
Calcium: 10 mg/dL (ref 8.7–10.3)
Chloride: 99 mmol/L (ref 96–106)
Creatinine, Ser: 0.73 mg/dL (ref 0.57–1.00)
Glucose: 94 mg/dL (ref 65–99)
Potassium: 4.2 mmol/L (ref 3.5–5.2)
Sodium: 138 mmol/L (ref 134–144)
eGFR: 86 mL/min/{1.73_m2} (ref >59)

## 2020-12-24 LAB — TSH: TSH: 1.5 u[IU]/mL (ref 0.450–4.500)

## 2020-12-24 NOTE — Patient Instructions (Signed)
Medication Instructions:  Your physician recommends that you continue on your current medications as directed. Please refer to the Current Medication list given to you today.  *If you need a refill on your cardiac medications before your next appointment, please call your pharmacy*   Lab Work: TODAY: BMET, CBC, TSH  If you have labs (blood work) drawn today and your tests are completely normal, you will receive your results only by: [place] [facility] (if you have MyChart) OR A paper copy in the mail If you have any lab test that is abnormal or we need to change your treatment, we will call you to review the results.   Follow-Up: At [facility], you and your health needs are our priority.  As part of our continuing mission to provide you with exceptional heart care, we have created designated Provider Care Teams.  These Care Teams include your primary Cardiologist (physician) and Advanced Practice Providers (APPs -  Physician Assistants and Nurse Practitioners) who all work together to provide you with the care you need, when you need it.   Your next appointment:   01/21/2021 with Oda Kilts, PA-C

## 2020-12-29 ENCOUNTER — Ambulatory Visit: Payer: Medicare Other | Admitting: Student

## 2020-12-29 ENCOUNTER — Telehealth: Payer: Self-pay | Admitting: Internal Medicine

## 2020-12-29 MED ORDER — FLECAINIDE ACETATE 100 MG PO TABS: 100.0000 mg | ORAL_TABLET | Freq: Two times a day (BID) | ORAL | 3 refills | Status: DC

## 2020-12-29 NOTE — Telephone Encounter (Signed)
   Pt c/o medication issue:  1. Name of Medication: flecainide (TAMBOCOR) 100 MG tablet  2. How are you currently taking this medication (dosage and times per day)? Take 1 tablet (100 mg total) by mouth 2 (two) times daily.  3. Are you having a reaction (difficulty breathing--STAT)?   4. What is your medication issue? Pt said she spoke with [facility] and she was informed that they did not receive prescription with the new dosage of this meds. Pt is requesting to speak with Dr. Jackalyn Lombard nurse

## 2020-12-29 NOTE — Telephone Encounter (Signed)
Called the pharmacy. They stated the did not receive the e- prescription on 12/11/20. Let the patient know and resent the medication to [facility].

## 2021-01-20 NOTE — Progress Notes (Signed)
PCP:  Josetta Huddle, MD Primary Cardiologist: None Electrophysiologist: Thompson Grayer, MD   Shannon [name] is a 75 y.o. female seen today for Thompson Grayer, MD for routine electrophysiology followup.  Since last being seen in our clinic the patient reports doing the same. She has noticed no change in her symptoms whatsoever on flecainide. She has noticed trouble sleeping and episode of nighttime palpitations since her COVID booster in April. She has tachy-palpitations for 60-90 seconds, 5-6 times through the night. Today, she states she has been up since 0100.  she denies chest pain, dyspnea, PND, orthopnea, nausea, vomiting, dizziness, syncope, edema, weight gain, or early satiety.  Past Medical History:  Diagnosis Date   Anxiety    Arthritis    Heart murmur    Hypertension    Palpitations    Past Surgical History:  Procedure Laterality Date   ABDOMINAL HYSTERECTOMY     APPENDECTOMY     ARTHRODESIS METATARSALPHALANGEAL JOINT (MTPJ) Left 07/02/2020   Procedure: Left hallux metatarsophalangeal joint arthrodesis;  Surgeon: Wylene Simmer, MD;  Location: [facility];  Service: Orthopedics;  Laterality: Left;   BACK SURGERY  1981   CHOLECYSTECTOMY     EYE SURGERY Bilateral    Cataract   TONSILLECTOMY     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      Cholecalciferol (VITAMIN D3) 50 MCG (2000 UT) TABS Take 2,000 mg by mouth daily.     flecainide (TAMBOCOR) 100 MG tablet Take 1 tablet (100 mg total) by mouth 2 (two) times daily. 180 tablet 3   ibuprofen (ADVIL) 400 MG tablet Take 400 mg by mouth every 6 (six) hours as needed.     losartan-hydrochlorothiazide (HYZAAR) 100-12.5 MG  tablet Take 1 tablet by mouth daily. Patient states she takes 1/2 tablet now     rosuvastatin (CRESTOR) 20 MG tablet Take 1 tablet (20 mg total) by mouth daily. 90 tablet 3   verapamil (CALAN-SR) 120 MG CR tablet Take 1 tablet (120 mg total) by mouth at bedtime. 90 tablet 3   vitamin B-12 (CYANOCOBALAMIN) 1000 MCG tablet Take 1,000 mcg by mouth daily.     No current facility-administered medications for this visit.   Facility-Administered Medications Ordered in Other Visits  Medication Dose Route Frequency Provider Last Rate Last Admin   technetium tetrofosmin (TC-MYOVIEW) injection 82.4 millicurie  23.5 millicurie Intravenous Once PRN O'Neal, Cassie Freer, MD        Allergies  Allergen Reactions   Cephalexin Hives    Pt received ancef 07/02/2020 and did well   Morphine Other (See Comments) and Palpitations   Penicillins Hives    Pt received ancef on 07/02/2020 with no reaction   Norco [Hydrocodone-Acetaminophen] Hives   Oxycodone Hives    Social History   Socioeconomic History   Marital status: Married    Spouse name: Not on file   Number of children: Not on file   Years of education: Not on file   Highest education level: Not on file  Occupational History   Not on file  Tobacco Use   Smoking status: Never   Smokeless tobacco: Never  Vaping Use   Vaping Use: Never used  Substance and Sexual Activity   Alcohol use: Yes    Comment: occasional   Drug use: Never   Sexual activity: Not on file  Other Topics Concern   Not on file  Social History Narrative   Not on file   Social Determinants of Health   Financial Resource Strain: Not on file  Food Insecurity: Not on file  Transportation Needs: Not on file  Physical Activity: Not on file  Stress: Not on file  Social Connections: Not on file  Intimate Partner Violence: Not on file     Review of Systems: All other systems reviewed and are otherwise negative except as noted above.  Physical Exam: Vitals:   01/22/21  0806  BP: 106/68  Pulse: 67  SpO2: 98%  Weight: 138 lb 3.2 oz (62.7 kg)  Height: 5\' 4"  (1.626 m)    GEN- The patient is well appearing, alert and oriented x 3 today.   HEENT: normocephalic, atraumatic; sclera clear, conjunctiva pink; hearing intact; oropharynx clear; neck supple, no JVP Lymph- no cervical lymphadenopathy Lungs- Clear to ausculation bilaterally, normal work of breathing.  No wheezes, rales, rhonchi Heart- Regular rate and rhythm, no murmurs, rubs or gallops, PMI not laterally displaced GI- soft, non-tender, non-distended, bowel sounds present, no hepatosplenomegaly Extremities- no clubbing, cyanosis, or edema; DP/PT/radial pulses 2+ bilaterally MS- no significant deformity or atrophy Skin- warm and dry, no rash or lesion Psych- euthymic mood, full affect Neuro- strength and sensation are intact  EKG is ordered. Personal review of EKG from today shows NSR at 67 bpm, QRS 92 ms, PR interval 208 ms  Additional studies reviewed include: Previous EP office notes.   Assessment and Plan:  1. Non-sustained Atrial tachycardia Felt to be overall benign, but patient is unfortunately quiet symptomatic in the evenings.  Metoprolol previously stopped due to HAs She has had NO benefit after two months of flecainide with up-titration. Will stop Add bisoprolol 5 mg qhs to see if this has benefit.  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 I will request her Zio patch records, which were performed by Dr. Inda Merlin but has not yet been scanned into the system.   2. Insomnia/Anxiety I think these are both contributing.  Xanax DOES help with both her evening palpitations and insomnia (at times) I have recommended close PCP follow up for these. Perhaps treating this is more  likely to help overall.  Her lack of any improvement on beta blockers, CCB, or flecainide lead me to consider they may not be her primary issue.  Her brief palpitations may be more tolerable if should could get some respite from her insomnia and anxiety surrounding them.  Shirley Friar, PA-C  01/22/21 8:09 AM

## 2021-01-21 ENCOUNTER — Ambulatory Visit: Payer: Medicare Other | Admitting: Student

## 2021-01-21 DIAGNOSIS — H524 Presbyopia: Secondary | ICD-10-CM | POA: Diagnosis not present

## 2021-01-21 DIAGNOSIS — Z961 Presence of intraocular lens: Secondary | ICD-10-CM | POA: Diagnosis not present

## 2021-01-22 ENCOUNTER — Ambulatory Visit (INDEPENDENT_AMBULATORY_CARE_PROVIDER_SITE_OTHER): Payer: Medicare Other | Admitting: Student

## 2021-01-22 ENCOUNTER — Other Ambulatory Visit: Payer: Self-pay

## 2021-01-22 ENCOUNTER — Encounter: Payer: Self-pay | Admitting: Student

## 2021-01-22 VITALS — BP 106/68 | HR 67 | Ht 64.0 in | Wt 138.2 lb

## 2021-01-22 DIAGNOSIS — F419 Anxiety disorder, unspecified: Secondary | ICD-10-CM

## 2021-01-22 DIAGNOSIS — G4700 Insomnia, unspecified: Secondary | ICD-10-CM | POA: Diagnosis not present

## 2021-01-22 DIAGNOSIS — I471 Supraventricular tachycardia: Secondary | ICD-10-CM

## 2021-01-22 MED ORDER — BISOPROLOL FUMARATE 5 MG PO TABS: 5.0000 mg | ORAL_TABLET | Freq: Every day | ORAL | 3 refills | Status: DC

## 2021-01-22 NOTE — Patient Instructions (Signed)
Medication Instructions:  Your physician has recommended you make the following change in your medication:   DISCONTINUE: Flecainide  START: Bisoprolol 5mg  daily at bedtime  *If you need a refill on your cardiac medications before your next appointment, please call your pharmacy*   Lab Work: None  If you have labs (blood work) drawn today and your tests are completely normal, you will receive your results only by: [place] (if you have MyChart) OR A paper copy in the mail If you have any lab test that is abnormal or we need to change your treatment, we will call you to review the results.   Follow-Up: At [facility], you and your health needs are our priority.  As part of our continuing mission to provide you with exceptional heart care, we have created designated Provider Care Teams.  These Care Teams include your primary Cardiologist (physician) and Advanced Practice Providers (APPs -  Physician Assistants and Nurse Practitioners) who all work together to provide you with the care you need, when you need it.   Your next appointment:   03/05/2021

## 2021-02-01 ENCOUNTER — Other Ambulatory Visit: Payer: Self-pay | Admitting: Internal Medicine

## 2021-02-01 DIAGNOSIS — Z1231 Encounter for screening mammogram for malignant neoplasm of breast: Secondary | ICD-10-CM

## 2021-02-17 DIAGNOSIS — E782 Mixed hyperlipidemia: Secondary | ICD-10-CM | POA: Diagnosis not present

## 2021-02-17 DIAGNOSIS — Z8249 Family history of ischemic heart disease and other diseases of the circulatory system: Secondary | ICD-10-CM | POA: Diagnosis not present

## 2021-02-18 LAB — LIPID PANEL
Chol/HDL Ratio: 2.5 ratio (ref 0.0–4.4)
Cholesterol, Total: 173 mg/dL (ref 100–199)
HDL: 70 mg/dL (ref >39)
LDL Chol Calc (NIH): 87 mg/dL (ref 0–99)
Triglycerides: 85 mg/dL (ref 0–149)
VLDL Cholesterol Cal: 16 mg/dL (ref 5–40)

## 2021-02-18 LAB — HEPATIC FUNCTION PANEL
ALT: 13 IU/L (ref 0–32)
AST: 20 IU/L (ref 0–40)
Albumin: 4.7 g/dL (ref 3.7–4.7)
Alkaline Phosphatase: 98 IU/L (ref 44–121)
Bilirubin Total: 0.6 mg/dL (ref 0.0–1.2)
Bilirubin, Direct: 0.18 mg/dL (ref 0.00–0.40)
Total Protein: 6.8 g/dL (ref 6.0–8.5)

## 2021-02-24 ENCOUNTER — Ambulatory Visit: Payer: Medicare Other | Admitting: Cardiovascular Disease

## 2021-02-24 ENCOUNTER — Encounter: Payer: Self-pay | Admitting: Cardiovascular Disease

## 2021-02-24 ENCOUNTER — Other Ambulatory Visit: Payer: Self-pay

## 2021-02-24 VITALS — BP 122/68 | HR 59 | Ht 64.0 in | Wt 138.4 lb

## 2021-02-24 DIAGNOSIS — I10 Essential (primary) hypertension: Secondary | ICD-10-CM | POA: Diagnosis not present

## 2021-02-24 DIAGNOSIS — I351 Nonrheumatic aortic (valve) insufficiency: Secondary | ICD-10-CM | POA: Diagnosis not present

## 2021-02-24 DIAGNOSIS — I471 Supraventricular tachycardia: Secondary | ICD-10-CM

## 2021-02-24 DIAGNOSIS — Z8249 Family history of ischemic heart disease and other diseases of the circulatory system: Secondary | ICD-10-CM

## 2021-02-24 DIAGNOSIS — E782 Mixed hyperlipidemia: Secondary | ICD-10-CM

## 2021-02-24 MED ORDER — ROSUVASTATIN CALCIUM 40 MG PO TABS: 40.0000 mg | ORAL_TABLET | Freq: Every day | ORAL | 3 refills | Status: DC

## 2021-02-24 NOTE — Assessment & Plan Note (Signed)
History of mild to moderate aortic insufficiency by 2D echo performed in 2016.  We will repeat a 2D echocardiogram.

## 2021-02-24 NOTE — Progress Notes (Addendum)
06/04/2021 Shannon [name]   [birth year]  [id number]  Primary Physician Josetta Huddle, MD Primary Cardiologist: Lorretta Harp MD Lupe Carney, [place]  HPI:  Shannon [name] is a 75 y.o.  thin appearing married Caucasian female mother of 1 son, grandmother and 1 grandchild who was referred by Dr. Mertha Finders for symptomatic tachypalpitations.  I last saw her in the office 11/18/2020.  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  She never smoked.  There is a family history for heart disease with a father that had myocardial infarction.  She has treated hypertension hyperlipidemia.  She is never had a heart attack or stroke.  She denies chest pain or shortness of breath.  She has noticed the onset of nightly tachypalpitations began in the early a.m. hours and lasting on and off until she wakes up in the morning.  Recent Zio patch revealed runs of nonsustained ventricular tachycardia and SVT.  2D echo performed in December revealed normal LV systolic function with moderate AI unchanged from an echo performed in 2016.  She says during these episodes she does feel some fullness in her chest.  Lab work read by Dr. Inda Merlin  was apparently unremarkable.  I referred her to Dr. Rayann Heman who saw her on 11/26/2020.  He changed her from metoprolol to bisoprolol and added flecainide.  She is scheduled to see a EP APP sometime in the next couple weeks.  Her nocturnal palpitations have somewhat improved in frequency and severity.  Because of a coronary calcium score of 12 I did increase her Crestor to daily however her LDL only came down from 94-87.  I prefer her LDL be less than 70.   Current Meds  Medication Sig   ALPRAZolam (XANAX) 0.25 MG tablet Take 0.125-0.25 mg by mouth at bedtime.    bisoprolol (ZEBETA) 5 MG tablet Take 1 tablet (5 mg total) by mouth at bedtime.   losartan-hydrochlorothiazide (HYZAAR) 100-12.5 MG tablet Take 1 tablet by mouth daily. Patient states  she takes 1/2 tablet now   verapamil (CALAN-SR) 120 MG CR tablet Take 1 tablet (120 mg total) by mouth at bedtime.   vitamin B-12 (CYANOCOBALAMIN) 1000 MCG tablet Take 1,000 mcg by mouth daily.   [DISCONTINUED] rosuvastatin (CRESTOR) 20 MG tablet Take 1 tablet (20 mg total) by mouth daily.     Allergies  Allergen Reactions   Cephalexin Hives    Pt received ancef 07/02/2020 and did well   Morphine Other (See Comments) and Palpitations   Penicillins Hives    Pt received ancef on 07/02/2020 with no reaction   Norco [Hydrocodone-Acetaminophen] Hives   Oxycodone Hives    Social History   Socioeconomic History   Marital status: Married    Spouse name: Not on file   Number of children: Not on file   Years of education: Not on file   Highest education level: Not on file  Occupational History   Not on file  Tobacco Use   Smoking status: Never   Smokeless tobacco: Never  Vaping Use   Vaping Use: Never used  Substance and Sexual Activity   Alcohol use: Yes    Comment: occasional   Drug use: Never   Sexual activity: Not on file  Other Topics Concern   Not on file  Social History Narrative   Not on file   Social Determinants of Health   Financial Resource Strain: Not on file  Food Insecurity: Not  on file  Transportation Needs: Not on file  Physical Activity: Not on file  Stress: Not on file  Social Connections: Not on file  Intimate Partner Violence: Not on file     Review of Systems: General: negative for chills, fever, night sweats or weight changes.  Cardiovascular: negative for chest pain, dyspnea on exertion, edema, orthopnea, palpitations, paroxysmal nocturnal dyspnea or shortness of breath Dermatological: negative for rash Respiratory: negative for cough or wheezing Urologic: negative for hematuria Abdominal: negative for nausea, vomiting, diarrhea, bright red blood per rectum, melena, or hematemesis Neurologic: negative for visual changes, syncope, or dizziness All  other systems reviewed and are otherwise negative except as noted above.    Blood pressure 122/68, pulse (!) 59, height 5\' 4"  (1.626 m), weight 138 lb 6.4 oz (62.8 kg), SpO2 100 %.  General appearance: alert and no distress Neck: no adenopathy, no carotid bruit, no JVD, supple, symmetrical, trachea midline, and thyroid not enlarged, symmetric, no tenderness/mass/nodules Lungs: clear to auscultation bilaterally Heart: regular rate and rhythm, S1, S2 normal, no murmur, click, rub or gallop Extremities: extremities normal, atraumatic, no cyanosis or edema Pulses: 2+ and symmetric Skin: Skin color, texture, turgor normal. No rashes or lesions Neurologic: Grossly normal  EKG not performed today  ASSESSMENT AND PLAN:   SVT (supraventricular tachycardia) (HCC) History of palpitations found to be atrial tachycardia and some rare nonacute sustained ventricular tachycardia which began after her COVID booster.  She currently is on bisoprolol, verapamil and flecainide.  The palpitations have become somewhat less frequent and intense.  She was seeing Dr. Rayann Heman who is retiring.  I am transitioning her to Dr. Quentin Ore.  Essential hypertension History of essential hypertension a blood pressure measured today at 122/68.  She is on bisoprolol, losartan, hydrochlorothiazide and verapamil.  Aortic insufficiency History of mild to moderate aortic insufficiency by 2D echo performed in 2016.  We will repeat a 2D echocardiogram.  Hyperlipidemia History of hyperlipidemia currently on Crestor 20 mg a day which reduced her LDL from 94-87 according to her most recent lipid profile performed 02/17/2021.  She did have a coronary calcium score recently that was 12 with calcium primarily in the RCA territory.  I prefer her LDL be less than 70.  I am going to increase her Crestor from 20 to 40 mg and we will recheck a lipid liver profile in 3 months.  Addendum: Lipid profile performed 05/12/2021 revealed total  cholesterol 173, LDL of 91 and HDL of 59.  She is not at goal for secondary prevention.  I am going to refer her to Dr. Debara Pickett for further evaluation and treatment.     Lorretta Harp MD FACP,FACC,FAHA, [facility] 06/04/2021 4:32 PM

## 2021-02-24 NOTE — Assessment & Plan Note (Addendum)
History of hyperlipidemia currently on Crestor 20 mg a day which reduced her LDL from 94-87 according to her most recent lipid profile performed 02/17/2021.  She did have a coronary calcium score recently that was 12 with calcium primarily in the RCA territory.  I prefer her LDL be less than 70.  I am going to increase her Crestor from 20 to 40 mg and we will recheck a lipid liver profile in 3 months.  Addendum: Lipid profile performed 05/12/2021 revealed total cholesterol 173, LDL of 91 and HDL of 59.  She is not at goal for secondary prevention.  I am going to refer her to Dr. Debara Pickett for further evaluation and treatment.

## 2021-02-24 NOTE — Patient Instructions (Signed)
Medication Instructions:   -Increase rosuvastatin (crestor) to 40mg  once daily.  *If you need a refill on your cardiac medications before your next appointment, please call your pharmacy*   Lab Work: Your physician recommends that you return for lab work in: 3 months for FASTING lipid/liver profile.  If you have labs (blood work) drawn today and your tests are completely normal, you will receive your results only by: [place] (if you have MyChart) OR A paper copy in the mail If you have any lab test that is abnormal or we need to change your treatment, we will call you to review the results.   Testing/Procedures: Your physician has requested that you have an echocardiogram. Echocardiography is a painless test that uses sound waves to create images of your heart. It provides your doctor with information about the size and shape of your heart and how well your heart's chambers and valves are working. This procedure takes approximately one hour. There are no restrictions for this procedure. This procedure is done at [address].    Follow-Up: At [facility], you and your health needs are our priority.  As part of our continuing mission to provide you with exceptional heart care, we have created designated Provider Care Teams.  These Care Teams include your primary Cardiologist (physician) and Advanced Practice Providers (APPs -  Physician Assistants and Nurse Practitioners) who all work together to provide you with the care you need, when you need it.  We recommend signing up for the patient portal called "MyChart".  Sign up information is provided on this After Visit Summary.  MyChart is used to connect with patients for Virtual Visits (Telemedicine).  Patients are able to view lab/test results, encounter notes, upcoming appointments, etc.  Non-urgent messages can be sent to your provider as well.   To learn more about what you can do with MyChart, go to [email or web address].     Your next appointment:   6 month(s)  The format for your next appointment:   In Person  Provider:   Quay Burow, MD

## 2021-02-24 NOTE — Assessment & Plan Note (Signed)
History of essential hypertension a blood pressure measured today at 122/68.  She is on bisoprolol, losartan, hydrochlorothiazide and verapamil.

## 2021-02-24 NOTE — Assessment & Plan Note (Signed)
History of palpitations found to be atrial tachycardia and some rare nonacute sustained ventricular tachycardia which began after her COVID booster.  She currently is on bisoprolol, verapamil and flecainide.  The palpitations have become somewhat less frequent and intense.  She was seeing Dr. Rayann Heman who is retiring.  I am transitioning her to Dr. Quentin Ore.

## 2021-03-04 NOTE — Progress Notes (Signed)
Cardiology Office Note Date:  03/05/2021  Patient ID:  Shannon [name], Shannon [name] 1945-09-29, MRN [id number] PCP:  Josetta Huddle, MD  Cardiologist:  Dr. Gwenlyn Found Electrophysiologist: Dr. Rayann Heman    Chief Complaint:  6 week f/u   History of Present Illness: Shannon [name] is a 75 y.o. female with history of ATach, anxiety, HTN  NOTE Hx of flecainide stopped with ongoing palpitations Metoprolol gave her headaches   She comes in today to be seen for Dr. Rayann Heman.   Most recently seen by A. 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 She was also advised to discuss her anxiety and insomnia with her PMD noting some nights taking Xanax has helped her.  She saw Dr. Gwenlyn Found 02/24/21, at that visit reported some improvement in in her nighttime palpitations.  TODAY She agrees that she is doing better. Palpitations are less frequent and less symptomatic, shorter on the bisoprolol No CP No exertional intolerances.  She is busy and active all day, constantly on the move, is never aware of any palpitations with exertion or in the day time She feels like now, either just as she is due to get up she feels a fast flutter, these either wake her or happen just before she wakes perhaps. No near syncope or syncope.  She is due for a colonoscopy, asks if she would be ok to do a colonoscopy  Past Medical History:  Diagnosis Date   Anxiety    Arthritis    Heart murmur    Hypertension    Palpitations     Past Surgical History:  Procedure Laterality Date   ABDOMINAL HYSTERECTOMY     APPENDECTOMY     ARTHRODESIS  METATARSALPHALANGEAL JOINT (MTPJ) Left 07/02/2020   Procedure: Left hallux metatarsophalangeal joint arthrodesis;  Surgeon: Wylene Simmer, MD;  Location: [facility];  Service: Orthopedics;  Laterality: Left;   BACK SURGERY  1981   CHOLECYSTECTOMY     EYE SURGERY Bilateral    Cataract   TONSILLECTOMY     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      bisoprolol (ZEBETA) 5 MG tablet Take 1 tablet (5 mg total) by mouth at bedtime. 90 tablet 3   Cholecalciferol (VITAMIN D3) 50 MCG (2000 UT) TABS Take 2,000 mg by mouth daily.     ibuprofen (ADVIL) 400 MG tablet Take 400 mg by mouth every 6 (six) hours as needed.     losartan-hydrochlorothiazide (HYZAAR) 100-12.5 MG tablet Take 1 tablet by mouth daily. Patient states she takes 1/2 tablet now     rosuvastatin (CRESTOR) 40 MG tablet Take 1 tablet (40 mg total) by mouth daily. 90 tablet 3   verapamil (CALAN-SR) 120 MG CR tablet Take 1 tablet (120 mg total) by mouth at bedtime. 90 tablet 3  vitamin B-12 (CYANOCOBALAMIN) 1000 MCG tablet Take 1,000 mcg by mouth daily.     No current facility-administered medications for this visit.   Facility-Administered Medications Ordered in Other Visits  Medication Dose Route Frequency Provider Last Rate Last Admin   technetium tetrofosmin (TC-MYOVIEW) injection 96.7 millicurie  89.3 millicurie Intravenous Once PRN O'Neal, Cassie Freer, MD        Allergies:   Cephalexin, Morphine, Penicillins, Norco [hydrocodone-acetaminophen], and Oxycodone   Social History:  The patient  reports that she has never smoked. She has never used smokeless tobacco. She reports current alcohol use. She reports that she does  not use drugs.   Family History:  The patient's family history includes Heart attack in her father.  ROS:  Please see the history of present illness.    All other systems are reviewed and otherwise negative.   PHYSICAL EXAM:  VS:  BP 114/64   Pulse (!) 58   Ht 5\' 4"  (1.626 m)   Wt 139 lb (63 kg)   SpO2 97%   BMI 23.86 kg/m  BMI: Body mass index is 23.86 kg/m. Well nourished, well developed, in no acute distress HEENT: normocephalic, atraumatic Neck: no JVD, carotid bruits or masses Cardiac:  RRR; no significant murmurs, no rubs, or gallops Lungs:  CTA b/l, no wheezing, rhonchi or rales Abd: soft, nontender MS: no deformity or atrophy Ext: no edema Skin: warm and dry, no rash Neuro:  No gross deficits appreciated Psych: euthymic mood, full affect   EKG:  not done today  10/28/20: stress myoview Nuclear stress EF: 57%. The left ventricular ejection fraction is normal (55-65%). There was no ST segment deviation noted during stress. No T wave inversion was noted during stress. The study is normal. This is a low risk study.   1.  Reduced counts in the apical segments with normal wall motion consistent with apical thinning artifact.  No evidence of ischemia or infarction. 2.  Normal LVEF, 57%. 3.  This is a low risk study.   04/29/2020: TTE LVEF 59.85% Grade I DD Mod AI  Recent Labs: 12/24/2020: BUN 14; Creatinine, Ser 0.73; Hemoglobin 13.7; Platelets 254; Potassium 4.2; Sodium 138; TSH 1.500 02/17/2021: ALT 13  02/17/2021: Chol/HDL Ratio 2.5; Cholesterol, Total 173; HDL 70; LDL Chol Calc (NIH) 87; Triglycerides 85   CrCl cannot be calculated (Patient's most recent lab result is older than the maximum 21 days allowed.).   Wt Readings from Last 3 Encounters:  03/05/21 139 lb (63 kg)  02/24/21 138 lb 6.4 oz (62.8 kg)  01/22/21 138 lb 3.2 oz (62.7 kg)     Other studies reviewed: Additional studies/records reviewed today include: summarized above  ASSESSMENT AND  PLAN:  Atrial tachycardia Nocturnal palpitations Better with current regime  We reviewed her monitor tracings together today All of the patient triggered events are SR, no ectopy, normal HR, some artifact The couple PATs and one NSVT were auto triggered  3. HTN Looks ok   Discussed that there was no cardiac contraindication to her having a colonoscopy  Disposition: F/u with her in 91mo to check in, if going well can move this out to 12mo or annually even.  Current medicines are reviewed at length with the patient today.  The patient did not have any concerns regarding medicines.  Venetia Night, PA-C 03/05/2021 6:11 PM     [place] [address] [phone] (office)  [phone] (fax)

## 2021-03-05 ENCOUNTER — Ambulatory Visit: Payer: Medicare Other | Admitting: Physician Assistant

## 2021-03-05 ENCOUNTER — Other Ambulatory Visit: Payer: Self-pay

## 2021-03-05 ENCOUNTER — Encounter: Payer: Self-pay | Admitting: Physician Assistant

## 2021-03-05 VITALS — BP 114/64 | HR 58 | Ht 64.0 in | Wt 139.0 lb

## 2021-03-05 DIAGNOSIS — R002 Palpitations: Secondary | ICD-10-CM

## 2021-03-05 DIAGNOSIS — I471 Supraventricular tachycardia: Secondary | ICD-10-CM | POA: Diagnosis not present

## 2021-03-05 DIAGNOSIS — I4719 Other supraventricular tachycardia: Secondary | ICD-10-CM

## 2021-03-05 DIAGNOSIS — I10 Essential (primary) hypertension: Secondary | ICD-10-CM | POA: Diagnosis not present

## 2021-03-05 NOTE — Patient Instructions (Signed)
Medication Instructions:   Your physician recommends that you continue on your current medications as directed. Please refer to the Current Medication list given to you today. ' *If you need a refill on your cardiac medications before your next appointment, please call your pharmacy*   Lab Work: [place]   If you have labs (blood work) drawn today and your tests are completely normal, you will receive your results only by: [place] (if you have MyChart) OR A paper copy in the mail If you have any lab test that is abnormal or we need to change your treatment, we will call you to review the results.   Testing/Procedures:  NONE ORDERED  TODAY    Follow-Up: At [facility], you and your health needs are our priority.  As part of our continuing mission to provide you with exceptional heart care, we have created designated Provider Care Teams.  These Care Teams include your primary Cardiologist (physician) and Advanced Practice Providers (APPs -  Physician Assistants and Nurse Practitioners) who all work together to provide you with the care you need, when you need it.  We recommend signing up for the patient portal called "MyChart".  Sign up information is provided on this After Visit Summary.  MyChart is used to connect with patients for Virtual Visits (Telemedicine).  Patients are able to view lab/test results, encounter notes, upcoming appointments, etc.  Non-urgent messages can be sent to your provider as well.   To learn more about what you can do with MyChart, go to [email or web address].    Your next appointment:   3 month(s)  The format for your next appointment:   In Person  Provider:   You will see one of the following Advanced Practice Providers on your designated Care Team:   Tommye Standard, [place]   {  Other Instructions

## 2021-03-12 ENCOUNTER — Ambulatory Visit
Admission: RE | Admit: 2021-03-12 | Discharge: 2021-03-12 | Disposition: A | Discharge status: Home / Self Care | Payer: Medicare Other | Source: Ambulatory Visit | Attending: Internal Medicine | Admitting: Internal Medicine

## 2021-03-12 ENCOUNTER — Other Ambulatory Visit: Payer: Self-pay

## 2021-03-12 DIAGNOSIS — Z1231 Encounter for screening mammogram for malignant neoplasm of breast: Secondary | ICD-10-CM

## 2021-03-16 ENCOUNTER — Other Ambulatory Visit: Payer: Self-pay | Admitting: Internal Medicine

## 2021-03-16 DIAGNOSIS — R928 Other abnormal and inconclusive findings on diagnostic imaging of breast: Secondary | ICD-10-CM

## 2021-04-14 ENCOUNTER — Ambulatory Visit
Admission: RE | Admit: 2021-04-14 | Discharge: 2021-04-14 | Disposition: A | Discharge status: Home / Self Care | Payer: Medicare Other | Source: Ambulatory Visit | Attending: Internal Medicine | Admitting: Internal Medicine

## 2021-04-14 ENCOUNTER — Ambulatory Visit: Payer: Medicare Other

## 2021-04-14 DIAGNOSIS — R922 Inconclusive mammogram: Secondary | ICD-10-CM | POA: Diagnosis not present

## 2021-04-14 DIAGNOSIS — R928 Other abnormal and inconclusive findings on diagnostic imaging of breast: Secondary | ICD-10-CM

## 2021-05-05 ENCOUNTER — Other Ambulatory Visit: Payer: Self-pay

## 2021-05-05 ENCOUNTER — Ambulatory Visit (HOSPITAL_COMMUNITY): Payer: Medicare Other | Attending: Cardiology

## 2021-05-05 DIAGNOSIS — I10 Essential (primary) hypertension: Secondary | ICD-10-CM | POA: Diagnosis not present

## 2021-05-05 DIAGNOSIS — I471 Supraventricular tachycardia: Secondary | ICD-10-CM

## 2021-05-05 DIAGNOSIS — I351 Nonrheumatic aortic (valve) insufficiency: Secondary | ICD-10-CM | POA: Diagnosis not present

## 2021-05-05 LAB — ECHOCARDIOGRAM COMPLETE
AR max vel: 1.9 cm2
AV Area VTI: 1.93 cm2
AV Area mean vel: 1.85 cm2
AV Mean grad: 7.5 mmHg
AV Peak grad: 13.3 mmHg
Ao pk vel: 1.83 m/s
Area-P 1/2: 2.39 cm2
MV M vel: 4.15 m/s
MV Peak grad: 68.9 mmHg
P 1/2 time: 598 msec
S' Lateral: 3 cm

## 2021-05-12 DIAGNOSIS — Z23 Encounter for immunization: Secondary | ICD-10-CM | POA: Diagnosis not present

## 2021-05-12 DIAGNOSIS — M1712 Unilateral primary osteoarthritis, left knee: Secondary | ICD-10-CM | POA: Diagnosis not present

## 2021-05-12 DIAGNOSIS — Z0000 Encounter for general adult medical examination without abnormal findings: Secondary | ICD-10-CM | POA: Diagnosis not present

## 2021-05-12 DIAGNOSIS — E559 Vitamin D deficiency, unspecified: Secondary | ICD-10-CM | POA: Diagnosis not present

## 2021-05-12 DIAGNOSIS — R002 Palpitations: Secondary | ICD-10-CM | POA: Diagnosis not present

## 2021-05-12 DIAGNOSIS — I351 Nonrheumatic aortic (valve) insufficiency: Secondary | ICD-10-CM | POA: Diagnosis not present

## 2021-05-12 DIAGNOSIS — F341 Dysthymic disorder: Secondary | ICD-10-CM | POA: Diagnosis not present

## 2021-05-12 DIAGNOSIS — E782 Mixed hyperlipidemia: Secondary | ICD-10-CM | POA: Diagnosis not present

## 2021-05-12 DIAGNOSIS — Z1389 Encounter for screening for other disorder: Secondary | ICD-10-CM | POA: Diagnosis not present

## 2021-05-14 ENCOUNTER — Encounter: Payer: Self-pay | Admitting: Neurology

## 2021-05-19 ENCOUNTER — Encounter: Payer: Self-pay | Admitting: Cardiovascular Disease

## 2021-06-03 DIAGNOSIS — Z86010 Personal history of colonic polyps: Secondary | ICD-10-CM | POA: Diagnosis not present

## 2021-06-03 DIAGNOSIS — I471 Supraventricular tachycardia: Secondary | ICD-10-CM | POA: Diagnosis not present

## 2021-06-05 NOTE — Progress Notes (Signed)
Cardiology Office Note Date:  06/07/2021  Patient ID:  Shannon [name], Shannon [name] 06/08/1945, MRN [id number] PCP:  Josetta Huddle, MD  Cardiologist:  Dr. Gwenlyn Found Electrophysiologist: Dr. Rayann Heman    Chief Complaint:  6 week f/u   History of Present Illness: Shannon [name] is a 76 y.o. female with history of ATach, anxiety, HTN  NOTE Hx of flecainide stopped with ongoing palpitations Metoprolol gave her headaches   She comes in today to be seen for Dr. Rayann Heman.   Most recently seen by A. 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 She was also advised to discuss her anxiety and insomnia with her PMD noting some nights taking Xanax has helped her.  She saw Dr. Gwenlyn Found 02/24/21, at that visit reported some improvement in in her nighttime palpitations.  I saw her 03/05/21 She agrees that she is doing better. Palpitations are less frequent and less symptomatic, shorter on the bisoprolol No CP No exertional intolerances.  She is busy and active all day, constantly on the move, is never aware of any palpitations with exertion or in the day time She feels like now, either just as she is due to get up she feels a fast flutter, these either wake her or happen just before she wakes perhaps. No near syncope or syncope. She is due for a colonoscopy, asks if she would be ok to do a colonoscopy We reviewed her monitor tracings together today All of the patient triggered events are SR, no ectopy, normal HR, some artifact The couple PATs and one NSVT were auto triggered No changes were made, planned to see her in 80mo,  sooner if needed.   TODAY Generally she is doing great except she has significant palpitations. Previously she was mostly bothered by palpitations at HS, evenings keeping her from sleep, these are better with the bisoprolol, though now being woken up nearly every morning in the last months. They are fast and persistent until she gets up gets a glass of water, and by the time back to bed or sits are resolved, but are lasting minutes now. No associated symptoms, she is not dizzy, no CP, SOB, but they are very anxiety provoking   She does not feel them through the day  Outside of that, she remains very active, walks 2-24miles most days weather permitting with good exertional capacity No SOB, DOE She will randomly feel a heavy sense in her chest, this is infrequent random, short lived, no exertional or positional  May she will be having a surveillance colonoscopy 2/2 polyps found in 2019   Past Medical History:  Diagnosis Date   Anxiety    Arthritis    Heart murmur    Hypertension    Palpitations     Past Surgical History:  Procedure Laterality Date   ABDOMINAL HYSTERECTOMY     APPENDECTOMY     ARTHRODESIS METATARSALPHALANGEAL JOINT (MTPJ) Left 07/02/2020   Procedure: Left hallux metatarsophalangeal joint arthrodesis;  Surgeon: Wylene Simmer, MD;  Location: [facility];  Service: Orthopedics;  Laterality: Left;   [place]  EYE SURGERY Bilateral    Cataract   TONSILLECTOMY     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      bisoprolol (ZEBETA) 5 MG tablet Take 1 tablet (5 mg total) by mouth at bedtime. 90 tablet 3   Cholecalciferol (VITAMIN  D3) 50 MCG (2000 UT) TABS Take 2,000 mg by mouth daily.     ibuprofen (ADVIL) 400 MG tablet Take 400 mg by mouth every 6 (six) hours as needed.     losartan-hydrochlorothiazide (HYZAAR) 100-12.5 MG tablet Take 1 tablet by mouth daily. Patient states she takes 1/2 tablet now     rosuvastatin (CRESTOR) 40 MG tablet Take 1 tablet (40 mg total) by mouth daily. 90 tablet 3   verapamil (CALAN-SR) 120 MG CR tablet Take 1 tablet (120 mg total) by mouth at bedtime. 90 tablet 3   No current facility-administered medications for this visit.   Facility-Administered Medications Ordered in Other Visits  Medication Dose Route Frequency Provider Last Rate Last Admin   technetium tetrofosmin (TC-MYOVIEW) injection 79.8 millicurie  92.1 millicurie Intravenous Once PRN O'Neal, Cassie Freer, MD        Allergies:   Cephalexin, Morphine, Penicillins, Norco [hydrocodone-acetaminophen], and Oxycodone   Social History:  The patient  reports that she has never smoked. She has never used smokeless tobacco. She reports current alcohol use. She reports that she does not use drugs.   Family History:  The patient's family history includes Heart attack in her father.  ROS:  Please see the history of present illness.    All other systems are reviewed and otherwise negative.   PHYSICAL EXAM:  VS:  BP (!) 102/44 (BP Location: Left Arm, Patient Position: Sitting, Cuff Size: Normal)    Pulse 70    Ht 5\' 4"  (1.626 m)    Wt 139 lb 6.4 oz (63.2 kg)    SpO2 98%    BMI 23.93 kg/m  BMI: Body mass index is 23.93 kg/m. Well nourished, well developed, in no acute distress HEENT: normocephalic, atraumatic Neck: no JVD, carotid bruits or masses Cardiac:  RRR; no significant murmurs, no rubs, or gallops Lungs:  CTA b/l, no wheezing, rhonchi or rales Abd: soft, nontender MS: no deformity or atrophy Ext: no edema Skin: warm and dry, no rash Neuro:  No gross deficits appreciated Psych: euthymic mood, full affect   EKG:  done  today and reviewed by myself SB 55bpm, normal intervals  05/05/21: TTE IMPRESSIONS   1. Left ventricular ejection fraction, by estimation, is 60 to 65%. The  left ventricle has normal function. The left ventricle has no regional  wall motion abnormalities. Left ventricular diastolic parameters were  normal.   2. Right ventricular systolic function is normal. The right ventricular  size is normal. There is normal pulmonary artery systolic pressure. The  estimated right ventricular systolic pressure is 19.4 mmHg.   3. The mitral valve is normal in structure. Mild mitral valve  regurgitation. No evidence of mitral stenosis. 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   4. The aortic valve is tricuspid. There is mild calcification of the  aortic  valve. Aortic valve regurgitation is moderate. No aortic stenosis  is present.   5. The inferior vena cava is normal in size with greater than 50%  respiratory variability, suggesting right atrial pressure of 3 mmHg.    10/28/20: stress myoview Nuclear stress EF: 57%. The left ventricular ejection fraction is normal (55-65%). There was no ST segment deviation noted during stress. No T wave inversion was noted during stress. The study is normal. This is a low risk study.   1.  Reduced counts in the apical segments with normal wall motion consistent with apical thinning artifact.  No evidence of ischemia or infarction. 2.  Normal LVEF, 57%. 3.  This is a low risk study.   04/29/2020: TTE LVEF 59.85% Grade I DD Mod AI  Recent Labs: 12/24/2020: BUN 14; Creatinine, Ser 0.73; Hemoglobin 13.7; Platelets 254; Potassium 4.2; Sodium 138; TSH 1.500 02/17/2021: ALT 13  02/17/2021: Chol/HDL Ratio 2.5; Cholesterol, Total 173; HDL 70; LDL Chol Calc (NIH) 87; Triglycerides 85   CrCl cannot be calculated (Patient's most recent lab result is older than the maximum 21 days allowed.).   Wt  Readings from Last 3 Encounters:  06/07/21 139 lb 6.4 oz (63.2 kg)  03/05/21 139 lb (63 kg)  02/24/21 138 lb 6.4 oz (62.8 kg)     Other studies reviewed: Additional studies/records reviewed today include: summarized above  ASSESSMENT AND PLAN:  Atrial tachycardia  Behavior has changed from previous Waking her daily, lasting minutes, not particularly symptomatic though very anxiety provoking to her ? If she has developed another arrhythmia? With SB, hesitate but think we would increase her bisoprolol some at HS.    ? Is it a mew or  ablatable rhythm?   3. HTN Looks ok  4. VHD Mod AI on her last echo  5. Brief chest heaviness Sounds atypical No ischemic/new EKG changes Low risk myoview June 2022    Disposition: will follow up monitor findings, Dr. Gwenlyn Found has recommended Dr. Quentin Ore to her, will have her establish with him in a few months.  Current medicines are reviewed at length with the patient today.  The patient did not have any concerns regarding medicines.  Venetia Night, PA-C 06/07/2021 2:07 PM     [place] [address] [phone] (office)  [phone] (fax)

## 2021-06-07 ENCOUNTER — Other Ambulatory Visit: Payer: Self-pay

## 2021-06-07 ENCOUNTER — Encounter: Payer: Self-pay | Admitting: Physician Assistant

## 2021-06-07 ENCOUNTER — Ambulatory Visit (INDEPENDENT_AMBULATORY_CARE_PROVIDER_SITE_OTHER): Payer: Medicare Other | Admitting: Physician Assistant

## 2021-06-07 ENCOUNTER — Ambulatory Visit (INDEPENDENT_AMBULATORY_CARE_PROVIDER_SITE_OTHER): Payer: Medicare Other

## 2021-06-07 VITALS — BP 102/44 | HR 70 | Ht 64.0 in | Wt 139.4 lb

## 2021-06-07 DIAGNOSIS — I10 Essential (primary) hypertension: Secondary | ICD-10-CM | POA: Diagnosis not present

## 2021-06-07 DIAGNOSIS — I471 Supraventricular tachycardia: Secondary | ICD-10-CM | POA: Diagnosis not present

## 2021-06-07 DIAGNOSIS — R002 Palpitations: Secondary | ICD-10-CM

## 2021-06-07 DIAGNOSIS — I351 Nonrheumatic aortic (valve) insufficiency: Secondary | ICD-10-CM

## 2021-06-07 NOTE — Progress Notes (Unsigned)
Enrolled patient for a 3 day Zio XT monitor to be mailed to patients home  Dr. Quentin Ore to read

## 2021-06-07 NOTE — Patient Instructions (Addendum)
Medication Instructions:   Your physician recommends that you continue on your current medications as directed. Please refer to the Current Medication list given to you today.   *If you need a refill on your cardiac medications before your next appointment, please call your pharmacy*   Lab Work: [place]    If you have labs (blood work) drawn today and your tests are completely normal, you will receive your results only by: [place] (if you have MyChart) OR A paper copy in the mail If you have any lab test that is abnormal or we need to change your treatment, we will call you to review the results.   Testing/Procedures: Your physician has recommended that you wear an event monitor. Event monitors are medical devices that record the hearts electrical activity. Doctors most often [place] these monitors to diagnose arrhythmias. Arrhythmias are problems with the speed or rhythm of the heartbeat. The monitor is a small, portable device. You can wear one while you do your normal daily activities. This is usually used to diagnose what is causing palpitations/syncope (passing out).   Follow-Up: At [facility], you and your health needs are our priority.  As part of our continuing mission to provide you with exceptional heart care, we have created designated Provider Care Teams.  These Care Teams include your primary Cardiologist (physician) and Advanced Practice Providers (APPs -  Physician Assistants and Nurse Practitioners) who all work together to provide you with the care you need, when you need it.  We recommend signing up for the patient portal called "MyChart".  Sign up information is provided on this After Visit Summary.  MyChart is used to connect with patients for Virtual Visits (Telemedicine).  Patients are able to view lab/test results, encounter notes, upcoming appointments, etc.  Non-urgent messages can be sent to your provider as well.   To learn more about what you  can do with MyChart, go to [email or web address].    Your next appointment:  AS NEW PATIENT  3 month(s)  The format for your next appointment:   In Person  Provider:   Lars Mage, MD  NEW PATIENT {   Other Instructions   ZIO XT- Long Term Monitor Instructions  Your physician has requested you wear a ZIO patch monitor for  3 days.  This is a single patch monitor. [employer] supplies one patch monitor per enrollment. Additional stickers are not available. Please do not apply patch if you will be having a Nuclear Stress Test,  Echocardiogram, Cardiac CT, MRI, or Chest Xray during the period you would be wearing the  monitor. The patch cannot be worn during these tests. You cannot remove and re-apply the  ZIO XT patch monitor.  Your ZIO patch monitor will be mailed 3 day USPS to your address on file. It may take 3-5 days  to receive your monitor after you have been enrolled.  Once you have received your monitor, please review the enclosed instructions. Your monitor  has already been registered assigning a specific monitor serial # to you.  Billing and Patient Assistance Program Information  We have supplied [employer] with any of your insurance information on file for billing purposes. [employer] offers a sliding scale Patient Assistance Program for patients that do not have  insurance, or whose insurance does not completely cover the cost of the ZIO monitor.  You must apply for the Patient Assistance Program to qualify for this discounted rate.  To apply, please call [employer] at [phone],  select option 4, select option 2, ask to apply for  Patient Assistance Program. Theodore Demark will ask your household income, and how many people  are in your household. They will quote your out-of-pocket cost based on that information.  [employer] will also be able to set up a 22-month, interest-free payment plan if needed.  Applying the monitor   Shave hair from upper left chest.  Hold abrader disc  by orange tab. Rub abrader in 40 strokes over the upper left chest as  indicated in your monitor instructions.  Clean area with 4 enclosed alcohol pads. Let dry.  Apply patch as indicated in monitor instructions. Patch will be placed under collarbone on left  side of chest with arrow pointing upward.  Rub patch adhesive wings for 2 minutes. Remove white label marked "1". Remove the white  label marked "2". Rub patch adhesive wings for 2 additional minutes.  While looking in a mirror, press and release button in center of patch. A small green light will  flash 3-4 times. This will be your only indicator that the monitor has been turned on.  Do not shower for the first 24 hours. You may shower after the first 24 hours.  Press the button if you feel a symptom. You will hear a small click. Record Date, Time and  Symptom in the Patient Logbook.  When you are ready to remove the patch, follow instructions on the last 2 pages of Patient  Logbook. Stick patch monitor onto the last page of Patient Logbook.  Place Patient Logbook in the blue and white box. Use locking tab on box and tape box closed  securely. The blue and white box has prepaid postage on it. Please place it in the mailbox as  soon as possible. Your physician should have your test results approximately 7 days after the  monitor has been mailed back to [facility].  Call [place] at [phone] if you have questions regarding  your ZIO XT patch monitor. Call them immediately if you see an orange light blinking on your  monitor.  If your monitor falls off in less than 4 days, contact our Monitor department at [phone].  If your monitor becomes loose or falls off after 4 days call [employer] at [phone] for  suggestions on securing your monitor

## 2021-06-11 DIAGNOSIS — R002 Palpitations: Secondary | ICD-10-CM

## 2021-06-17 ENCOUNTER — Telehealth: Payer: Self-pay

## 2021-06-17 NOTE — Telephone Encounter (Signed)
° °  Pre-operative Risk Assessment    Patient Name: Shannon [name]  DOB: Jan 20, 1946 MRN: [id number]    Request for Surgical Clearance  Procedure:   Colonoscopy   Date of Surgery:  Clearance 09/01/21                               Surgeon:  Dr Ronnette Juniper Surgeon's Group or Practice Name:  [facility]  Phone number:  [phone] Fax number:  [phone]   Type of Clearance Requested:   - Medical   Type of Anesthesia:   Propofol  Additional requests/questions:   N/A  Job Founds T   06/17/2021, 4:42 PM

## 2021-06-18 NOTE — Telephone Encounter (Signed)
Patient recently evaluated by Tommye Standard PA-C with noted ongoing palpitations and chest heaviness felt to be atypical . Patient is currently wearing a Zio. Will route to [facility], as she was recently evaluated on 06/07/2021 for input regarding preoperative cardiac risk stratification for colonoscopy. Please route back to the preop pool.

## 2021-06-23 DIAGNOSIS — R002 Palpitations: Secondary | ICD-10-CM | POA: Diagnosis not present

## 2021-07-19 ENCOUNTER — Ambulatory Visit: Payer: Medicare Other | Admitting: Neurology

## 2021-08-13 ENCOUNTER — Ambulatory Visit: Payer: Medicare Other | Admitting: Neurology

## 2021-09-09 ENCOUNTER — Encounter: Payer: Self-pay | Admitting: Cardiology

## 2021-09-09 ENCOUNTER — Ambulatory Visit: Payer: Medicare Other | Admitting: Cardiology

## 2021-09-09 VITALS — BP 112/68 | HR 56 | Ht 64.0 in | Wt 139.0 lb

## 2021-09-09 DIAGNOSIS — R002 Palpitations: Secondary | ICD-10-CM

## 2021-09-09 DIAGNOSIS — I10 Essential (primary) hypertension: Secondary | ICD-10-CM | POA: Diagnosis not present

## 2021-09-09 DIAGNOSIS — I351 Nonrheumatic aortic (valve) insufficiency: Secondary | ICD-10-CM

## 2021-09-09 NOTE — Progress Notes (Signed)
?Electrophysiology Office Note:   ? ?Date:  09/09/2021  ? ?ID:  Shannon [name], DOB Aug 07, 1945, MRN [id number] ? ?PCP:  Josetta Huddle, MD  ?[facility] Cardiologist:  None  ?[facility] [facility] Electrophysiologist:  Vickie Epley, MD  ? ?Referring MD: Josetta Huddle, MD  ? ?Chief Complaint: Follow-up ? ?History of Present Illness:   ? ?Shannon [name] is a 76 y.o. female who presents for follow-up. Their medical history includes hypertension, heart murmur, arthritis, and anxiety. Previously followed by Dr. Rayann Heman and Dr. Gwenlyn Found. ? ?She was last seen in cardiology by Tommye Standard, PA-C on 06/07/2021. Her main complaint was ongoing significant palpitations. They were previously improved with bisoprolol, but she has been woken up nearly every morning for months due to her palpitations. There are no other associated symptoms aside from provoking anxiety. Her EKG at that visit showed sinus bradycardia at 55 bpm with normal intervals. Flecainide was previously stopped due to ongoing palpitations. Metoprolol was previously discontinued after causing headaches. ? ?Today, she confirms feeling "awful" palpitations when she wakes up in the mornings. They have become more intense recently. She states that "it is so startling that I have to stand up."  Also she notes that it will feel like her heart is beating out of her back as well. ? ?Overall she appears to describe two main types of palpitations. There are fast beats that she notices as a "flutter." There are also hard heart beats that cause her to stand up as mentioned above. This is because if she keeps lying down in bed, she feels the pounding which she describes as a "boom boom boom." ? ?We reviewed the results of her Zio monitor in detail. ? ?Of note, she initially noticed her worsening palpitations about 3 weeks after her 2nd COVID booster. ? ?She denies any chest pain, shortness of breath, or peripheral edema. No lightheadedness, headaches, syncope, orthopnea, or  PND. ? ? ?  ?Past Medical History:  ?Diagnosis Date  ? Anxiety   ? Arthritis   ? Heart murmur   ? Hypertension   ? Palpitations   ? ? ?Past Surgical History:  ?Procedure Laterality Date  ? ABDOMINAL HYSTERECTOMY    ? APPENDECTOMY    ? ARTHRODESIS METATARSALPHALANGEAL JOINT (MTPJ) Left 07/02/2020  ? Procedure: Left hallux metatarsophalangeal joint arthrodesis;  Surgeon: Wylene Simmer, MD;  Location: [facility];  Service: Orthopedics;  Laterality: Left;  ? [place]  ? CHOLECYSTECTOMY    ? EYE SURGERY Bilateral   ? Cataract  ? TONSILLECTOMY    ? 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 Procedure: TOTAL KNEE ARTHROPLASTY;  Surgeon: Paralee Cancel, MD;  Location: [facility];  Service: Orthopedics;  Laterality: Left;  70 mins  ? WRIST SURGERY Right   ? ? ?Current Medications: ?Current Meds  ?Medication Sig  ? ALPRAZolam (XANAX) 0.25 MG tablet Take 0.125-0.25 mg by mouth at bedtime.   ? bisoprolol (ZEBETA) 5 MG tablet Take 1 tablet (5 mg total) by mouth at bedtime.  ? Cholecalciferol (VITAMIN D3) 50 MCG (2000 UT) TABS Take 2,000 mg by mouth daily.  ? ibuprofen (ADVIL) 400 MG tablet Take 400 mg by mouth every 6 (six) hours as needed.  ? losartan-hydrochlorothiazide (HYZAAR) 100-12.5 MG tablet Take 0.5 tablets by mouth daily.  ? rosuvastatin (CRESTOR) 40 MG tablet Take 1 tablet (40 mg total) by mouth daily.  ? verapamil (CALAN-SR) 120 MG CR tablet Take 1 tablet (120 mg total) by mouth at bedtime.  ?  ? ?  Allergies:   Cephalexin, Morphine, Penicillins, Epinephrine, Hydrochlorothiazide, Hydrocodone, Oxycodone hcl, Pseudoephedrine hcl, Zolpidem tartrate, Norco [hydrocodone-acetaminophen], Oxycodone, and Penicillin v potassium  ? ?Social History  ? ?Socioeconomic History  ? Marital status: Married  ?  Spouse name: Not on file  ? Number of children: Not on file  ? Years of education: Not on file  ? Highest education level: Not on file  ?Occupational History  ? Not on file  ?Tobacco Use  ? Smoking status: Never  ?  Smokeless tobacco: Never  ?Vaping Use  ? Vaping Use: Never used  ?Substance and Sexual Activity  ? Alcohol use: Yes  ?  Comment: occasional  ? Drug use: Never  ? Sexual activity: Not on file  ?Other Topics Concern  ? Not on file  ?Social History Narrative  ? Not on file  ? ?Social Determinants of Health  ? ?Financial Resource Strain: Not on file  ?Food Insecurity: Not on file  ?Transportation Needs: Not on file  ?Physical Activity: Not on file  ?Stress: Not on file  ?Social Connections: Not on file  ?  ? ?Family History: ?The patient's family history includes Heart attack in her father. ? ?ROS:   ?Please see the history of present illness.    ?(+) Palpitations ?All other systems reviewed and are negative. ? ?EKGs/Labs/Other Studies Reviewed:   ? ?The following studies were reviewed today: ? ?06/2021  Monitor ?HR 45 - 128 bpm, average 57 bpm. ?Symptom triggered episodes correspond to sinus rhythm. ?No sustained arrhythmias. ? ?05/05/2021  Echo ? 1. Left ventricular ejection fraction, by estimation, is 60 to 65%. The left ventricle has normal function. The left ventricle has no regional wall motion abnormalities. Left ventricular diastolic parameters were normal.  ? 2. Right ventricular systolic function is normal. The right ventricular  ?size is normal. There is normal pulmonary artery systolic pressure. The estimated right ventricular systolic pressure is 99.2 mmHg.  ? 3. The mitral valve is normal in structure. Mild mitral valve  ?regurgitation. No evidence of mitral stenosis. There is mild holosystolic prolapse of the middle segment of the anterior leaflet of the mitral valve.  ? 4. The aortic valve is tricuspid. There is mild calcification of the  ?aortic valve. Aortic valve regurgitation is moderate. No aortic stenosis is present.  ? 5. The inferior vena cava is normal in size with greater than 50%  ?respiratory variability, suggesting right atrial pressure of 3 mmHg.  ? ? ? ? ?Recent Labs: ?12/24/2020: BUN 14;  Creatinine, Ser 0.73; Hemoglobin 13.7; Platelets 254; Potassium 4.2; Sodium 138; TSH 1.500 ?02/17/2021: ALT 13  ? ?Recent Lipid Panel ?   ?Component Value Date/Time  ? CHOL 173 02/17/2021 0911  ? TRIG 85 02/17/2021 0911  ? HDL 70 02/17/2021 0911  ? CHOLHDL 2.5 02/17/2021 0911  ? [place] 87 02/17/2021 0911  ? ? ?Physical Exam:   ? ?VS:  BP 112/68   Pulse (!) 56   Ht '5\' 4"'$  (1.626 m)   Wt 139 lb (63 kg)   SpO2 99%   BMI 23.86 kg/m?    ? ?Wt Readings from Last 3 Encounters:  ?09/09/21 139 lb (63 kg)  ?06/07/21 139 lb 6.4 oz (63.2 kg)  ?03/05/21 139 lb (63 kg)  ?  ? ?GEN: Well nourished, well developed in no acute distress ?HEENT: Normal ?NECK: No JVD; No carotid bruits ?LYMPHATICS: No lymphadenopathy ?CARDIAC: RRR, no murmurs, rubs, gallops ?RESPIRATORY:  Clear to auscultation without rales, wheezing or rhonchi  ?ABDOMEN: Soft, non-tender,  non-distended ?MUSCULOSKELETAL:  No edema; No deformity  ?SKIN: Warm and dry ?NEUROLOGIC:  Alert and oriented x 3 ?PSYCHIATRIC:  Normal affect  ? ? ?  ? ?ASSESSMENT:   ? ?1. Palpitations   ?2. Primary hypertension   ?3. Aortic valve insufficiency, etiology of cardiac valve disease unspecified   ? ?PLAN:   ? ?In order of problems listed above: ? ?#Palpitations ?Unclear cause. Recent zio monitor shows sinus rhythm during symptom triggered episodes when she reports heart flutters, etc. I went over the entire zio report today with the patient.  ? ?I discussed using a loop recorder for more long-term continuous monitoring of her heart rhythm. She will think about this as an option and let us know if she wants to proceed. ? ?For now, continue beta blocker at the current dosage. ? ? ?#HTN ?Controlled. Continue current therapy. ? ?#Aortic Valve Regurgitation ?Continue routine follow up/surveillance with Dr Gwenlyn Found. ? ? ? ?Total time spent with patient today 45 minutes. This includes reviewing records, evaluating the patient and coordinating care. ? ?Medication Adjustments/Labs and Tests  Ordered: ?Current medicines are reviewed at length with the patient today.  Concerns regarding medicines are outlined above.  ?No orders of the defined types were placed in this encounter. ? ?No orders of the defined t

## 2021-09-09 NOTE — Patient Instructions (Addendum)
Medication Instructions:  ?Your physician recommends that you continue on your current medications as directed. Please refer to the Current Medication list given to you today. ?*If you need a refill on your cardiac medications before your next appointment, please call your pharmacy* ? ?Lab Work: ?None. ?If you have labs (blood work) drawn today and your tests are completely normal, you will receive your results only by: ?MyChart Message (if you have MyChart) OR ?A paper copy in the mail ?If you have any lab test that is abnormal or we need to change your treatment, we will call you to review the results. ? ?Testing/Procedures: ?None. ? ?Follow-Up: ?At [facility], you and your health needs are our priority.  As part of our continuing mission to provide you with exceptional heart care, we have created designated Provider Care Teams.  These Care Teams include your primary Cardiologist (physician) and Advanced Practice Providers (APPs -  Physician Assistants and Nurse Practitioners) who all work together to provide you with the care you need, when you need it. ? ?Your physician wants you to follow-up in: Please call Otila Kluver RN if you wish to move forward with a loop implant.  ? ?We recommend signing up for the patient portal called "MyChart".  Sign up information is provided on this After Visit Summary.  MyChart is used to connect with patients for Virtual Visits (Telemedicine).  Patients are able to view lab/test results, encounter notes, upcoming appointments, etc.  Non-urgent messages can be sent to your provider as well.   ?To learn more about what you can do with MyChart, go to [email or web address].   ? ?Any Other Special Instructions Will Be Listed Below (If Applicable). ? ?CPT codes: 37106     26948       54627 ? ?Implantable Loop Recorder Placement ? ?An implantable loop recorder is a small electronic device that is placed under the skin of your chest. The device records the electrical activity of your heart  over a long period of time. Your health care provider can download these recordings to monitor your heart. ?You may need an implantable loop recorder if you have periods of abnormal heart activity (arrhythmias) or unexplained fainting (syncope). The recorder can be left in place for 1 year or longer. ?Tell a health care provider about: ?Any allergies you have. ?All medicines you are taking, including vitamins, herbs, eye drops, creams, and over-the-counter medicines. ?Any problems you or family members have had with anesthetic medicines. ?Any bleeding problems you have. ?Any surgeries you have had. ?Any medical conditions you have. ?Whether you are pregnant or may be pregnant. ?What are the risks? ?Generally, this is a safe procedure. However, problems may occur, including: ?Infection. ?Bleeding. ?Allergic reactions to anesthetic medicines. ?Damage to nerves or blood vessels. ?Failure of the device to work. This could require another surgery to replace it. ?What happens before the procedure? ? ?You may have a physical exam, blood tests, and imaging tests of your heart, such as a chest X-ray. ?Follow instructions from your health care provider about eating or drinking restrictions. ?Ask your health care provider about: ?Changing or stopping your regular medicines. This is especially important if you are taking diabetes medicines or blood thinners. ?Taking medicines such as aspirin and ibuprofen. These medicines can thin your blood. Do not take these medicines unless your health care provider tells you to take them. ?Taking over-the-counter medicines, vitamins, herbs, and supplements. ?Ask your health care provider how your surgical site will be marked or identified. ?Ask  your health care provider what steps will be taken to help prevent infection. These may include: ?Removing hair at the surgery site. ?Washing skin with a germ-killing soap. ?Plan to have someone take you home from the hospital or clinic. ?Plan to  have a responsible adult care for you for at least 24 hours after you leave the hospital or clinic. This is important. ?Do not use any products that contain nicotine or tobacco, such as cigarettes and e-cigarettes. If you need help quitting, ask your health care provider. ?What happens during the procedure? ?An IV will be inserted into one of your veins. ?You may be given one or more of the following: ?A medicine to help you relax (sedative). ?A medicine to numb the area (local anesthetic). ?A small incision will be made on the left side of your upper chest. ?A pocket will be created under your skin. ?The device will be placed in the pocket. ?The incision will be closed with stitches (sutures) or adhesive strips. ?A bandage (dressing) will be placed over the incision. ?The procedure may vary among health care providers and hospitals. ?What happens after the procedure? ?Your blood pressure, heart rate, breathing rate, and blood oxygen level will be monitored until you leave the hospital or clinic. ?You may be able to go home on the day of your surgery. Before you go home: ?Your health care provider will program your recorder. ?You will learn how to trigger your device with a handheld activator. ?You will learn how to send recordings to your health care provider. ?You will get an ID card for your device, and you will be told when to use it. ?Do not drive for 24 hours if you were given a sedative during your procedure. ?Summary ?An implantable loop recorder is a small electronic device that is placed under the skin of your chest to monitor your heart over a long period of time. ?The recorder can be left in place for 1 year or longer. ?Plan to have someone take you home from the hospital or clinic. ?This information is not intended to replace advice given to you by your health care provider. Make sure you discuss any questions you have with your health care provider. ?Document Revised: 08/18/2020 Document Reviewed:  08/18/2020 ?Elsevier Patient Education ? [place]. ? ? ? ?  ? ? ?

## 2021-09-13 ENCOUNTER — Telehealth: Payer: Self-pay | Admitting: Cardiology

## 2021-09-13 NOTE — Telephone Encounter (Signed)
Spoke with patient to discuss information regarding loop record implantation. ? ?Patient states she was given CPT codes at last OV to give her insurance company to determine the cost/coverage for each procedure. Patient states her insurance company then asked her where she would be having the procedure done (ambulatory surgical center vs hospital) and if there is a charge for monitoring what is the charge. ? ?Patient called stating she was being asked information she does not have and is asking for guidance from our office. She states she has been trying to get information on cost/coverage before deciding on having loop recorder procedure performed. ? ?Dr. Mardene Speak nurse is out of the office this week, will forward to device team and pre-auth for further guidance on this matter. ?

## 2021-09-13 NOTE — Telephone Encounter (Signed)
Patient is requesting to speak with Dr. Mardene Speak nurse regarding possible loop implant and getting information to her insurance company. ?

## 2021-09-15 NOTE — Telephone Encounter (Signed)
Outreach made to Pt. ? ?Pt had attempted to call her insurance to determine what her OOP cost would be for loop implant and monthly monitoring, but was unable to get information from her insurance company. ? ?This nurse called insurance company and spoke with representative Nori Riis.  Per Nori Riis, there is a 0$ copay for office implant of loop recorder. ? ?For monthly monitoring, if considered diagnostic, copay could range between 0$ monthly cost to as much as $25 a month. ? ?Pt advised.  Pt scheduled for loop implant.   ? ?Work up complete. ?

## 2021-09-23 DIAGNOSIS — M545 Low back pain, unspecified: Secondary | ICD-10-CM | POA: Diagnosis not present

## 2021-10-13 NOTE — Progress Notes (Signed)
Electrophysiology Office Follow up Visit Note:    Date:  10/14/2021   ID:  BULAR HICKOK, DOB 01-Jan-1946, MRN [id number]  PCP:  Josetta Huddle, MD  [facility] Cardiologist:  None  [facility] Electrophysiologist:  Vickie Epley, MD    Interval History:    ESTELLAR CADENA is a 76 y.o. female who presents for a follow up visit. They were last seen in clinic 09/09/2021. At that visit she reported worsening palpitations with both pounding and fluttering qualities. After discussion she was going to consider a loop recorder implant.  Today, we reviewed the procedural details for a loop recorder implant. She wishes to proceed today.   She denies any palpitations, chest pain, shortness of breath, or peripheral edema. No lightheadedness, headaches, syncope, orthopnea, or PND.  (+)     Past Medical History:  Diagnosis Date   Anxiety    Arthritis    Heart murmur    Hypertension    Palpitations     Past Surgical History:  Procedure Laterality Date   ABDOMINAL HYSTERECTOMY     APPENDECTOMY     ARTHRODESIS METATARSALPHALANGEAL JOINT (MTPJ) Left 07/02/2020   Procedure: Left hallux metatarsophalangeal joint arthrodesis;  Surgeon: Wylene Simmer, MD;  Location: [place];  Service: Orthopedics;  Laterality: Left;   BACK SURGERY  1981   CHOLECYSTECTOMY     EYE SURGERY Bilateral    Cataract   TONSILLECTOMY     TOTAL KNEE ARTHROPLASTY Left 09/26/2019   Procedure: TOTAL KNEE ARTHROPLASTY;  Surgeon: Paralee Cancel, MD;  Location: [facility];  Service: Orthopedics;  Laterality: Left;  70 mins   WRIST SURGERY Right     Current Medications: Current Meds  Medication Sig   ALPRAZolam (XANAX) 0.25 MG tablet Take 0.125-0.25 mg by mouth at bedtime.    bisoprolol (ZEBETA) 5 MG tablet Take 1 tablet (5 mg total) by mouth at bedtime.   Cholecalciferol (VITAMIN D3) 50 MCG (2000 UT) TABS Take 2,000 mg by mouth daily.   ibuprofen (ADVIL) 400 MG tablet Take 400 mg by mouth every 6  (six) hours as needed.   losartan-hydrochlorothiazide (HYZAAR) 100-12.5 MG tablet Take 0.5 tablets by mouth daily.   rosuvastatin (CRESTOR) 40 MG tablet Take 1 tablet (40 mg total) by mouth daily.   verapamil (CALAN-SR) 120 MG CR tablet Take 1 tablet (120 mg total) by mouth at bedtime.     Allergies:   Cephalexin, Morphine, Penicillins, Epinephrine, Hydrochlorothiazide, Hydrocodone, Oxycodone hcl, Pseudoephedrine hcl, Zolpidem tartrate, Norco [hydrocodone-acetaminophen], Oxycodone, and Penicillin v potassium   Social History   Socioeconomic History   Marital status: Married    Spouse name: Not on file   Number of children: Not on file   Years of education: Not on file   Highest education level: Not on file  Occupational History   Not on file  Tobacco Use   Smoking status: Never   Smokeless tobacco: Never  Vaping Use   Vaping Use: Never used  Substance and Sexual Activity   Alcohol use: Yes    Comment: occasional   Drug use: Never   Sexual activity: Not on file  Other Topics Concern   Not on file  Social History Narrative   Not on file   Social Determinants of Health   Financial Resource Strain: Not on file  Food Insecurity: Not on file  Transportation Needs: Not on file  Physical Activity: Not on file  Stress: Not on file  Social Connections: Not on file  Family History: The patient's family history includes Heart attack in her father.  ROS:   Please see the history of present illness.    All other systems reviewed and are negative.  EKGs/Labs/Other Studies Reviewed:    The following studies were reviewed today:  06/2021  Monitor HR 45 - 128 bpm, average 57 bpm. Symptom triggered episodes correspond to sinus rhythm. No sustained arrhythmias.   05/05/2021  Echo  1. Left ventricular ejection fraction, by estimation, is 60 to 65%. The left ventricle has normal function. The left ventricle has no regional wall motion abnormalities. Left ventricular diastolic  parameters were normal.   2. Right ventricular systolic function is normal. The right ventricular  size is normal. There is normal pulmonary artery systolic pressure. The estimated right ventricular systolic pressure is 40.9 mmHg.   3. The mitral valve is normal in structure. Mild mitral valve  regurgitation. No evidence of mitral stenosis. There is mild holosystolic prolapse of the middle segment of the anterior leaflet of the mitral valve.   4. The aortic valve is tricuspid. There is mild calcification of the  aortic valve. Aortic valve regurgitation is moderate. No aortic stenosis is present.   5. The inferior vena cava is normal in size with greater than 50%  respiratory variability, suggesting right atrial pressure of 3 mmHg.   EKG:  EKG is personally reviewed.  10/14/2021: EKG was not ordered.   Recent Labs: 12/24/2020: BUN 14; Creatinine, Ser 0.73; Hemoglobin 13.7; Platelets 254; Potassium 4.2; Sodium 138; TSH 1.500 02/17/2021: ALT 13   Recent Lipid Panel    Component Value Date/Time   CHOL 173 02/17/2021 0911   TRIG 85 02/17/2021 0911   HDL 70 02/17/2021 0911   CHOLHDL 2.5 02/17/2021 0911   LDLCALC 87 02/17/2021 0911    Physical Exam:    VS:  BP 116/62   Pulse 72   Ht '5\' 4"'$  (1.626 m)   Wt 136 lb (61.7 kg)   SpO2 99%   BMI 23.34 kg/m     Wt Readings from Last 3 Encounters:  10/14/21 136 lb (61.7 kg)  09/09/21 139 lb (63 kg)  06/07/21 139 lb 6.4 oz (63.2 kg)     GEN: Well nourished, well developed in no acute distress HEENT: Normal NECK: No JVD; No carotid bruits LYMPHATICS: No lymphadenopathy CARDIAC: RRR, no murmurs, rubs, gallops RESPIRATORY:  Clear to auscultation without rales, wheezing or rhonchi  ABDOMEN: Soft, non-tender, non-distended MUSCULOSKELETAL:  No edema; No deformity  SKIN: Warm and dry NEUROLOGIC:  Alert and oriented x 3 PSYCHIATRIC:  Normal affect        ASSESSMENT:    1. SVT (supraventricular tachycardia) ([place])   2. Nonsustained  ventricular tachycardia (HCC)    PLAN:    In order of problems listed above:  #SVT Work-up has been unrevealing thus far including ZIO monitor.  Previously have discussed loop recorder monitoring with the patient to help correlate symptoms to abnormal heart rhythms.  She wished to proceed and presents today for loop recorder implant.  I discussed the loop recorder implant process with the patient in detail during today's visit include the risks and recovery.  I discussed the monthly monitoring cost.  She wishes to proceed.   Medication Adjustments/Labs and Tests Ordered: Current medicines are reviewed at length with the patient today.  Concerns regarding medicines are outlined above.  No orders of the defined types were placed in this encounter.  No orders of the defined types were placed in this  encounter.   I,Mathew Stumpf,acting as a Education administrator for Vickie Epley, MD.,have documented all relevant documentation on the behalf of Vickie Epley, MD,as directed by  Vickie Epley, MD while in the presence of Vickie Epley, MD.  I, Vickie Epley, MD, have reviewed all documentation for this visit. The documentation on 10/14/21 for the exam, diagnosis, procedures, and orders are all accurate and complete.   Signed, Lars Mage, MD, [facility], [facility] 10/14/2021 8:26 AM    Electrophysiology [facility]   ----------------------  SURGEON:  Lars Mage, MD    PREPROCEDURE DIAGNOSIS: Palpitations    POSTPROCEDURE DIAGNOSIS: Palpitations     PROCEDURES:   1. Implantable loop recorder implantation    INTRODUCTION:  TONDALAYA PERREN is a 76 y.o. female with a history of palpitations who presents today for implantable loop implantation.  The monitoring costs associated with loop recorder monitoring have been discussed with the patient.    DESCRIPTION OF PROCEDURE:  Informed written consent was obtained.  The patient required no sedation for the  procedure today.  Mapping over the patient's chest was performed to identify the area where electrograms were most prominent for ILR recording.  This area was found to be the left parasternal region over the 3rd-4th intercostal space. The patients left chest was therefore prepped and draped in the usual sterile fashion. The skin overlying the left parasternal region was infiltrated with lidocaine for local analgesia.  A 0.5-cm incision was made over the left parasternal region over the 3rd intercostal space.  A subcutaneous ILR pocket was fashioned using a combination of sharp and blunt dissection.  A Medtronic Reveal Linq model [phone] [id number])  implantable loop recorder was then placed into the pocket  R waves were very prominent and measured >0.67m.  Steri- Strips and a sterile dressing were then applied.  There were no early apparent complications.     CONCLUSIONS:   1. Successful implantation of a Medtronic Reveal LINQ implantable loop recorder for palpitations.  2. No early apparent complications.    CLysbeth GalasT. LQuentin Ore MD, FThe Pav[facility] Foundation FSurgical [facility]woodsCardiac Electrophysiology

## 2021-10-14 ENCOUNTER — Encounter: Payer: Self-pay | Admitting: Cardiology

## 2021-10-14 ENCOUNTER — Ambulatory Visit: Payer: Medicare Other | Admitting: Cardiology

## 2021-10-14 VITALS — BP 116/62 | HR 72 | Ht 64.0 in | Wt 136.0 lb

## 2021-10-14 DIAGNOSIS — I4729 Other ventricular tachycardia: Secondary | ICD-10-CM

## 2021-10-14 DIAGNOSIS — I471 Supraventricular tachycardia: Secondary | ICD-10-CM | POA: Diagnosis not present

## 2021-10-14 NOTE — Patient Instructions (Signed)
Medication Instructions:  Your physician recommends that you continue on your current medications as directed. Please refer to the Current Medication list given to you today.  Labwork: None ordered.  Testing/Procedures: None ordered.  Follow-Up:  Your physician wants you to follow-up in: one year with Dr. Quentin Ore.  You will receive a reminder letter in the mail two months in advance. If you don't receive a letter, please call our office to schedule the follow-up appointment.    Implantable Loop Recorder Placement, Care After This sheet gives you information about how to care for yourself after your procedure. Your health care provider may also give you more specific instructions. If you have problems or questions, contact your health care provider. What can I expect after the procedure? After the procedure, it is common to have: Soreness or discomfort near the incision. Some swelling or bruising near the incision.  Follow these instructions at home: Incision care  Monitor your cardiac device site for redness, swelling, and drainage. Call the device clinic at [phone] if you experience these symptoms or fever/chills.  Keep the large square bandage on your site for 24 hours and then you may remove it yourself. Keep the steri-strips underneath in place.   You may shower after 72 hours / 3 days from your procedure with the steri-strips in place. They will usually fall off on their own, or may be removed after 10 days. Pat dry.   Avoid lotions, ointments, or perfumes over your incision until it is well-healed.  Please do not submerge in water until your site is completely healed.   Your device is MRI compatible.   Remote monitoring is used to monitor your cardiac device from home. This monitoring is scheduled every month by our office. It allows [place] to keep an eye on the function of your device to ensure it is working properly.  If your wound site starts to bleed apply pressure.       If you have any questions/concerns please call the device clinic at [phone].  Activity  Return to your normal activities.  General instructions Follow instructions from your health care provider about how to manage your implantable loop recorder and transmit the information. Learn how to activate a recording if this is necessary for your type of device. You may go through a metal detection gate, and you may let someone hold a metal detector over your chest. Show your ID card if needed. Do not have an MRI unless you check with your health care provider first. Take over-the-counter and prescription medicines only as told by your health care provider. Keep all follow-up visits as told by your health care provider. This is important. Contact a health care provider if: You have redness, swelling, or pain around your incision. You have a fever. You have pain that is not relieved by your pain medicine. You have triggered your device because of fainting (syncope) or because of a heartbeat that feels like it is racing, slow, fluttering, or skipping (palpitations). Get help right away if you have: Chest pain. Difficulty breathing. Summary After the procedure, it is common to have soreness or discomfort near the incision. Change your dressing as told by your health care provider. Follow instructions from your health care provider about how to manage your implantable loop recorder and transmit the information. Keep all follow-up visits as told by your health care provider. This is important. This information is not intended to replace advice given to you by your health care provider. Make  sure you discuss any questions you have with your health care provider. Document Released: 03/30/2015 Document Revised: 06/03/2017 Document Reviewed: 06/03/2017 Elsevier Patient Education  2020 [employer].

## 2021-10-20 ENCOUNTER — Telehealth: Payer: Self-pay

## 2021-10-20 NOTE — Telephone Encounter (Signed)
I answered the patient questions about her home monitor and remote scheduled.

## 2021-10-23 ENCOUNTER — Other Ambulatory Visit: Payer: Self-pay | Admitting: Cardiovascular Disease

## 2021-10-26 ENCOUNTER — Telehealth: Payer: Self-pay | Admitting: Cardiology

## 2021-11-18 ENCOUNTER — Ambulatory Visit (INDEPENDENT_AMBULATORY_CARE_PROVIDER_SITE_OTHER): Payer: Medicare Other

## 2021-11-18 DIAGNOSIS — I4729 Other ventricular tachycardia: Secondary | ICD-10-CM | POA: Diagnosis not present

## 2021-11-18 LAB — CUP PACEART REMOTE DEVICE CHECK
Date Time Interrogation Session: 20230720084428
Implantable Pulse Generator Implant Date: 20230615

## 2021-12-10 NOTE — Progress Notes (Signed)
Carelink Summary Report / Loop Recorder 

## 2021-12-17 DIAGNOSIS — R002 Palpitations: Secondary | ICD-10-CM | POA: Diagnosis not present

## 2021-12-17 DIAGNOSIS — Z658 Other specified problems related to psychosocial circumstances: Secondary | ICD-10-CM | POA: Diagnosis not present

## 2021-12-17 DIAGNOSIS — I351 Nonrheumatic aortic (valve) insufficiency: Secondary | ICD-10-CM | POA: Diagnosis not present

## 2021-12-17 DIAGNOSIS — M1712 Unilateral primary osteoarthritis, left knee: Secondary | ICD-10-CM | POA: Diagnosis not present

## 2021-12-21 ENCOUNTER — Ambulatory Visit (INDEPENDENT_AMBULATORY_CARE_PROVIDER_SITE_OTHER): Payer: Medicare Other

## 2021-12-21 DIAGNOSIS — I4729 Other ventricular tachycardia: Secondary | ICD-10-CM | POA: Diagnosis not present

## 2021-12-21 LAB — CUP PACEART REMOTE DEVICE CHECK
Date Time Interrogation Session: 20230822084722
Implantable Pulse Generator Implant Date: 20230615

## 2022-01-09 ENCOUNTER — Other Ambulatory Visit: Payer: Self-pay | Admitting: Student

## 2022-01-18 NOTE — Progress Notes (Signed)
Carelink Summary Report / Loop Recorder 

## 2022-01-20 DIAGNOSIS — M545 Low back pain, unspecified: Secondary | ICD-10-CM | POA: Diagnosis not present

## 2022-01-20 DIAGNOSIS — M546 Pain in thoracic spine: Secondary | ICD-10-CM | POA: Diagnosis not present

## 2022-01-24 ENCOUNTER — Ambulatory Visit (INDEPENDENT_AMBULATORY_CARE_PROVIDER_SITE_OTHER): Payer: Medicare Other

## 2022-01-24 DIAGNOSIS — I4729 Other ventricular tachycardia: Secondary | ICD-10-CM

## 2022-01-25 LAB — CUP PACEART REMOTE DEVICE CHECK
Date Time Interrogation Session: 20230924084929
Implantable Pulse Generator Implant Date: 20230615

## 2022-02-07 DIAGNOSIS — H524 Presbyopia: Secondary | ICD-10-CM | POA: Diagnosis not present

## 2022-02-07 DIAGNOSIS — Z961 Presence of intraocular lens: Secondary | ICD-10-CM | POA: Diagnosis not present

## 2022-02-09 NOTE — Progress Notes (Signed)
Carelink Summary Report / Loop Recorder 

## 2022-02-28 ENCOUNTER — Ambulatory Visit (INDEPENDENT_AMBULATORY_CARE_PROVIDER_SITE_OTHER): Payer: Medicare Other

## 2022-02-28 DIAGNOSIS — I4729 Other ventricular tachycardia: Secondary | ICD-10-CM

## 2022-02-28 LAB — CUP PACEART REMOTE DEVICE CHECK
Date Time Interrogation Session: 20231027084755
Implantable Pulse Generator Implant Date: 20230615

## 2022-03-02 ENCOUNTER — Ambulatory Visit: Payer: Medicare Other | Attending: Cardiovascular Disease | Admitting: Cardiovascular Disease

## 2022-03-02 ENCOUNTER — Encounter: Payer: Self-pay | Admitting: Cardiovascular Disease

## 2022-03-02 DIAGNOSIS — Z8249 Family history of ischemic heart disease and other diseases of the circulatory system: Secondary | ICD-10-CM | POA: Diagnosis not present

## 2022-03-02 DIAGNOSIS — I351 Nonrheumatic aortic (valve) insufficiency: Secondary | ICD-10-CM

## 2022-03-02 DIAGNOSIS — I10 Essential (primary) hypertension: Secondary | ICD-10-CM

## 2022-03-02 DIAGNOSIS — I4729 Other ventricular tachycardia: Secondary | ICD-10-CM

## 2022-03-02 DIAGNOSIS — E782 Mixed hyperlipidemia: Secondary | ICD-10-CM

## 2022-03-02 LAB — HEPATIC FUNCTION PANEL
ALT: 32 IU/L (ref 0–32)
AST: 22 IU/L (ref 0–40)
Albumin: 4.4 g/dL (ref 3.8–4.8)
Alkaline Phosphatase: 65 IU/L (ref 44–121)
Bilirubin Total: 0.6 mg/dL (ref 0.0–1.2)
Bilirubin, Direct: 0.2 mg/dL (ref 0.00–0.40)
Total Protein: 6.6 g/dL (ref 6.0–8.5)

## 2022-03-02 LAB — LIPID PANEL
Chol/HDL Ratio: 2.2 ratio (ref 0.0–4.4)
Cholesterol, Total: 193 mg/dL (ref 100–199)
HDL: 86 mg/dL (ref >39)
LDL Chol Calc (NIH): 91 mg/dL (ref 0–99)
Triglycerides: 92 mg/dL (ref 0–149)
VLDL Cholesterol Cal: 16 mg/dL (ref 5–40)

## 2022-03-02 MED ORDER — ROSUVASTATIN CALCIUM 40 MG PO TABS: 40.0000 mg | ORAL_TABLET | Freq: Every day | ORAL | 4 refills | Status: DC

## 2022-03-02 NOTE — Progress Notes (Signed)
03/02/2022 Shannon [name]   1946/04/07  [id number]  Primary Physician Josetta Huddle, MD Primary Cardiologist: Lorretta Harp MD Lupe Carney, [place]  HPI:  Shannon [name] is a 76 y.o.  thin appearing married Caucasian female mother of 1 son, grandmother and 1 grandchild who was referred by Dr. Mertha Finders for symptomatic tachypalpitations.  I last saw her in the office 06/04/2021.  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  She never smoked.  There is a family history for heart disease with a father that had myocardial infarction.  She has treated hypertension hyperlipidemia.  She is never had a heart attack or stroke.  She denies chest pain or shortness of breath.  She has noticed the onset of nightly tachypalpitations began in the early a.m. hours and lasting on and off until she wakes up in the morning.  Recent Zio patch revealed runs of nonsustained ventricular tachycardia and SVT.  2D echo performed in December revealed normal LV systolic function with moderate AI unchanged from an echo performed in 2016.  She says during these episodes she does feel some fullness in her chest.  Lab work read by Dr. Inda Merlin  was apparently unremarkable.  I referred her to Dr. Rayann Heman who saw her on 11/26/2020.  He changed her from metoprolol to bisoprolol and added flecainide.  She is scheduled to see a EP APP sometime in the next couple weeks.  Her nocturnal palpitations have somewhat improved in frequency and severity.  Because of a coronary calcium score of 12 I did increase her Crestor to daily however her LDL only came down from 94-87.  I prefer her LDL be less than 70.    She ultimately saw Dr. Lars Mage who implanted a loop recorder in June of this year.  Last several readings have shown no events.  She denies chest pain or shortness of breath.  She is having issues with her left knee.  Echo performed 1//23 did show normal LV systolic function with  moderate AI.   Current Meds  Medication Sig   ALPRAZolam (XANAX) 0.25 MG tablet Take 0.125-0.25 mg by mouth at bedtime.    bisoprolol (ZEBETA) 5 MG tablet TAKE 1 TABLET(5 MG) BY MOUTH AT BEDTIME   Cholecalciferol (VITAMIN D3) 50 MCG (2000 UT) TABS Take 2,000 mg by mouth daily.   ibuprofen (ADVIL) 400 MG tablet Take 400 mg by mouth every 6 (six) hours as needed.   losartan-hydrochlorothiazide (HYZAAR) 100-12.5 MG tablet Take 0.5 tablets by mouth daily.   rosuvastatin (CRESTOR) 40 MG tablet Take 1 tablet (40 mg total) by mouth daily.   verapamil (CALAN-SR) 120 MG CR tablet TAKE 1 TABLET(120 MG) BY MOUTH AT BEDTIME     Allergies  Allergen Reactions   Cephalexin Hives and Other (See Comments)    Pt received ancef 07/02/2020 and did well   Morphine Other (See Comments) and Palpitations   Penicillins Hives    Pt received ancef on 07/02/2020 with no reaction   Epinephrine Palpitations and Other (See Comments)    Other reaction(s): palpitations   Hydrochlorothiazide     Other reaction(s): weakness   Hydrocodone Hives   Oxycodone Hcl Hives   Pseudoephedrine Hcl     Other reaction(s): increased hyperactivity   Zolpidem Tartrate Diarrhea   Norco [Hydrocodone-Acetaminophen] Hives   Oxycodone Hives   Penicillin V Potassium Rash    Social History   Socioeconomic History   Marital status:  Married    Spouse name: Not on file   Number of children: Not on file   Years of education: Not on file   Highest education level: Not on file  Occupational History   Not on file  Tobacco Use   Smoking status: Never   Smokeless tobacco: Never  Vaping Use   Vaping Use: Never used  Substance and Sexual Activity   Alcohol use: Yes    Comment: occasional   Drug use: Never   Sexual activity: Not on file  Other Topics Concern   Not on file  Social History Narrative   Not on file   Social Determinants of Health   Financial Resource Strain: Not on file  Food Insecurity: Not on file   Transportation Needs: Not on file  Physical Activity: Not on file  Stress: Not on file  Social Connections: Not on file  Intimate Partner Violence: Not on file     Review of Systems: General: negative for chills, fever, night sweats or weight changes.  Cardiovascular: negative for chest pain, dyspnea on exertion, edema, orthopnea, palpitations, paroxysmal nocturnal dyspnea or shortness of breath Dermatological: negative for rash Respiratory: negative for cough or wheezing Urologic: negative for hematuria Abdominal: negative for nausea, vomiting, diarrhea, bright red blood per rectum, melena, or hematemesis Neurologic: negative for visual changes, syncope, or dizziness All other systems reviewed and are otherwise negative except as noted above.    Blood pressure 128/78, pulse (!) 49, height '5\' 4"'$  (1.626 m), weight 136 lb 6.4 oz (61.9 kg), SpO2 98 %.  General appearance: alert and no distress Neck: no adenopathy, no carotid bruit, no JVD, supple, symmetrical, trachea midline, and thyroid not enlarged, symmetric, no tenderness/mass/nodules Lungs: clear to auscultation bilaterally Heart: regular rate and rhythm, S1, S2 normal, no murmur, click, rub or gallop Extremities: extremities normal, atraumatic, no cyanosis or edema Pulses: 2+ and symmetric Skin: Skin color, texture, turgor normal. No rashes or lesions Neurologic: Grossly normal  EKG sinus bradycardia at 49 without ST or T wave changes.  Personally reviewed this EKG.  ASSESSMENT AND PLAN:   Nonsustained ventricular tachycardia (HCC) History of nonsustained VT and SVT on loop recorder.  She has seen Dr. Lars Mage for that.  She is currently on verapamil and bisoprolol.  She had a loop recorder implanted by Dr. Quentin Ore in June of this year which he is following.  She has had no recurrent episodes.  Essential hypertension History of essential hypertension with blood pressure measured today at 128/78.  She is on bisoprolol  and verapamil as well as losartan and hydrochlorothiazide.  Hyperlipidemia History of hyperlipidemia on rosuvastatin 40 mg a day with lipid profile performed 05/12/2021 revealing total cholesterol 173, LDL of 91 and HDL 69.  Given her coronary calcium score of 12 I would like her LDL to be less than 70.  We will check a lipid liver profile today.  Aortic insufficiency History of moderate aortic insufficiency by 2D echo most recently performed 1//23 her EF was normal.  Her right ventricular pressures were normal as well.  She had mild MR and moderate AI.  Her left ventricular end-diastolic dimensions were 5.1 cm.  We will recheck a 2D echo in January.     Lorretta Harp MD FACP,FACC,FAHA, [facility] 03/02/2022 10:07 AM

## 2022-03-02 NOTE — Assessment & Plan Note (Signed)
History of hyperlipidemia on rosuvastatin 40 mg a day with lipid profile performed 05/12/2021 revealing total cholesterol 173, LDL of 91 and HDL 69.  Given her coronary calcium score of 12 I would like her LDL to be less than 70.  We will check a lipid liver profile today.

## 2022-03-02 NOTE — Patient Instructions (Addendum)
Medication Instructions:  Your physician recommends that you continue on your current medications as directed. Please refer to the Current Medication list given to you today.  *If you need a refill on your cardiac medications before your next appointment, please call your pharmacy*   Lab Work: Your physician recommends that you have labs drawn today: Lipid/Liver panel   If you have labs (blood work) drawn today and your tests are completely normal, you will receive your results only by: MyChart Message (if you have MyChart) OR A paper copy in the mail If you have any lab test that is abnormal or we need to change your treatment, we will call you to review the results.   Testing/Procedures: Your physician has requested that you have an echocardiogram. Echocardiography is a painless test that uses sound waves to create images of your heart. It provides your doctor with information about the size and shape of your heart and how well your heart's chambers and valves are working. This procedure takes approximately one hour. There are no restrictions for this procedure. Please do NOT wear cologne, perfume, aftershave, or lotions (deodorant is allowed). Please arrive 15 minutes prior to your appointment time. To be done in January 2024. This procedure will be done at [address]    Follow-Up: At [facility], you and your health needs are our priority.  As part of our continuing mission to provide you with exceptional heart care, we have created designated Provider Care Teams.  These Care Teams include your primary Cardiologist (physician) and Advanced Practice Providers (APPs -  Physician Assistants and Nurse Practitioners) who all work together to provide you with the care you need, when you need it.  We recommend signing up for the patient portal called "MyChart".  Sign up information is provided on this After Visit Summary.  MyChart is used to connect with patients for  Virtual Visits (Telemedicine).  Patients are able to view lab/test results, encounter notes, upcoming appointments, etc.  Non-urgent messages can be sent to your provider as well.   To learn more about what you can do with MyChart, go to [email or web address].    Your next appointment:   6 month(s)  The format for your next appointment:   In Person  Provider:   Fabian Sharp, PA-C, Sande Rives, PA-C, Jory Sims, DNP, ANP, Almyra Deforest, PA-C, or Diona Browner, NP       Then, Quay Burow, MD will plan to see you again in 12 month(s).    Other Instructions 1 year follow up with Dr. Quentin Ore in June 2024.

## 2022-03-02 NOTE — Assessment & Plan Note (Signed)
History of nonsustained VT and SVT on loop recorder.  She has seen Dr. Lars Mage for that.  She is currently on verapamil and bisoprolol.  She had a loop recorder implanted by Dr. Quentin Ore in June of this year which he is following.  She has had no recurrent episodes.

## 2022-03-02 NOTE — Assessment & Plan Note (Signed)
History of essential hypertension with blood pressure measured today at 128/78.  She is on bisoprolol and verapamil as well as losartan and hydrochlorothiazide.

## 2022-03-02 NOTE — Assessment & Plan Note (Signed)
History of moderate aortic insufficiency by 2D echo most recently performed 1//23 her EF was normal.  Her right ventricular pressures were normal as well.  She had mild MR and moderate AI.  Her left ventricular end-diastolic dimensions were 5.1 cm.  We will recheck a 2D echo in January.

## 2022-03-08 ENCOUNTER — Other Ambulatory Visit: Payer: Self-pay | Admitting: Internal Medicine

## 2022-03-08 DIAGNOSIS — Z1231 Encounter for screening mammogram for malignant neoplasm of breast: Secondary | ICD-10-CM

## 2022-03-10 ENCOUNTER — Other Ambulatory Visit: Payer: Self-pay

## 2022-03-10 DIAGNOSIS — E782 Mixed hyperlipidemia: Secondary | ICD-10-CM

## 2022-03-11 ENCOUNTER — Other Ambulatory Visit: Payer: Self-pay

## 2022-03-11 ENCOUNTER — Telehealth: Payer: Self-pay | Admitting: Cardiovascular Disease

## 2022-03-11 DIAGNOSIS — E782 Mixed hyperlipidemia: Secondary | ICD-10-CM

## 2022-03-11 MED ORDER — EZETIMIBE 10 MG PO TABS: 10.0000 mg | ORAL_TABLET | Freq: Every day | ORAL | 3 refills | Status: DC

## 2022-03-11 NOTE — Telephone Encounter (Signed)
Spoke with patient. She asked for generic of Zetia '10mg'$  to be sent to [facility] on [address]. Lab order for fasting lipids in 3 months mailed to her home. Her PCP is now Okey Dupre (same location), updated in Epic.

## 2022-03-11 NOTE — Telephone Encounter (Signed)
Pt c/o medication issue:  1. Name of Medication:   bisoprolol (ZEBETA) 5 MG tablet   2. How are you currently taking this medication (dosage and times per day)?   Not taking this at all yet  3. Are you having a reaction (difficulty breathing--STAT)?   4. What is your medication issue?    Patient stated she would like a call back directly from [place] regarding this new medication.

## 2022-04-02 NOTE — Progress Notes (Signed)
Carelink Summary Report / Loop Recorder 

## 2022-04-04 ENCOUNTER — Ambulatory Visit (INDEPENDENT_AMBULATORY_CARE_PROVIDER_SITE_OTHER): Payer: Medicare Other

## 2022-04-04 DIAGNOSIS — M545 Low back pain, unspecified: Secondary | ICD-10-CM | POA: Diagnosis not present

## 2022-04-04 DIAGNOSIS — R002 Palpitations: Secondary | ICD-10-CM

## 2022-04-05 LAB — CUP PACEART REMOTE DEVICE CHECK
Date Time Interrogation Session: 20231203232106
Implantable Pulse Generator Implant Date: 20230615

## 2022-04-18 ENCOUNTER — Other Ambulatory Visit (HOSPITAL_COMMUNITY): Payer: Self-pay | Admitting: Orthopedic Surgery

## 2022-04-18 DIAGNOSIS — M545 Low back pain, unspecified: Secondary | ICD-10-CM

## 2022-05-03 ENCOUNTER — Other Ambulatory Visit (HOSPITAL_COMMUNITY): Payer: Medicare Other

## 2022-05-05 ENCOUNTER — Ambulatory Visit (HOSPITAL_COMMUNITY)
Admission: RE | Admit: 2022-05-05 | Discharge: 2022-05-05 | Disposition: A | Discharge status: Home / Self Care | Payer: Medicare Other | Source: Ambulatory Visit | Attending: Orthopedic Surgery | Admitting: Orthopedic Surgery

## 2022-05-05 ENCOUNTER — Ambulatory Visit (HOSPITAL_COMMUNITY): Payer: BLUE CROSS/BLUE SHIELD

## 2022-05-05 ENCOUNTER — Encounter (HOSPITAL_COMMUNITY): Payer: Self-pay

## 2022-05-05 DIAGNOSIS — M545 Low back pain, unspecified: Secondary | ICD-10-CM | POA: Diagnosis not present

## 2022-05-05 DIAGNOSIS — M5126 Other intervertebral disc displacement, lumbar region: Secondary | ICD-10-CM | POA: Diagnosis not present

## 2022-05-05 MED ORDER — GADOBUTROL 1 MMOL/ML IV SOLN
6.0000 mL | Freq: Once | INTRAVENOUS | Status: AC | PRN
  Administered 2022-05-05: 6 mL via INTRAVENOUS

## 2022-05-06 ENCOUNTER — Ambulatory Visit (HOSPITAL_COMMUNITY): Payer: Medicare Other | Attending: Cardiovascular Disease

## 2022-05-06 DIAGNOSIS — I4729 Other ventricular tachycardia: Secondary | ICD-10-CM | POA: Diagnosis not present

## 2022-05-06 DIAGNOSIS — Z8249 Family history of ischemic heart disease and other diseases of the circulatory system: Secondary | ICD-10-CM | POA: Diagnosis not present

## 2022-05-06 DIAGNOSIS — E782 Mixed hyperlipidemia: Secondary | ICD-10-CM | POA: Insufficient documentation

## 2022-05-06 DIAGNOSIS — I10 Essential (primary) hypertension: Secondary | ICD-10-CM | POA: Insufficient documentation

## 2022-05-06 DIAGNOSIS — I351 Nonrheumatic aortic (valve) insufficiency: Secondary | ICD-10-CM

## 2022-05-06 LAB — ECHOCARDIOGRAM COMPLETE
AR max vel: 1.25 cm2
AV Area VTI: 1.18 cm2
AV Area mean vel: 1.17 cm2
AV Mean grad: 8.7 mmHg
AV Peak grad: 15.1 mmHg
Ao pk vel: 1.94 m/s
Area-P 1/2: 3.99 cm2
P 1/2 time: 462 msec
S' Lateral: 3.1 cm

## 2022-05-09 ENCOUNTER — Ambulatory Visit
Admission: RE | Admit: 2022-05-09 | Discharge: 2022-05-09 | Disposition: A | Discharge status: Home / Self Care | Payer: Medicare Other | Source: Ambulatory Visit | Attending: Internal Medicine | Admitting: Internal Medicine

## 2022-05-09 ENCOUNTER — Ambulatory Visit (INDEPENDENT_AMBULATORY_CARE_PROVIDER_SITE_OTHER): Payer: Medicare Other

## 2022-05-09 DIAGNOSIS — Z1231 Encounter for screening mammogram for malignant neoplasm of breast: Secondary | ICD-10-CM | POA: Diagnosis not present

## 2022-05-09 DIAGNOSIS — R002 Palpitations: Secondary | ICD-10-CM | POA: Diagnosis not present

## 2022-05-10 ENCOUNTER — Other Ambulatory Visit: Payer: Self-pay | Admitting: Internal Medicine

## 2022-05-10 DIAGNOSIS — R928 Other abnormal and inconclusive findings on diagnostic imaging of breast: Secondary | ICD-10-CM

## 2022-05-10 LAB — CUP PACEART REMOTE DEVICE CHECK
Date Time Interrogation Session: 20240107231718
Implantable Pulse Generator Implant Date: 20230615

## 2022-05-11 ENCOUNTER — Ambulatory Visit (HOSPITAL_COMMUNITY): Payer: Medicare Other

## 2022-05-13 NOTE — Progress Notes (Signed)
Carelink Summary Report / Loop Recorder

## 2022-05-18 ENCOUNTER — Ambulatory Visit: Payer: Medicare Other

## 2022-05-18 ENCOUNTER — Ambulatory Visit
Admission: RE | Admit: 2022-05-18 | Discharge: 2022-05-18 | Disposition: A | Discharge status: Home / Self Care | Payer: Medicare Other | Source: Ambulatory Visit | Attending: Internal Medicine | Admitting: Internal Medicine

## 2022-05-18 DIAGNOSIS — R928 Other abnormal and inconclusive findings on diagnostic imaging of breast: Secondary | ICD-10-CM

## 2022-05-18 DIAGNOSIS — R922 Inconclusive mammogram: Secondary | ICD-10-CM | POA: Diagnosis not present

## 2022-06-06 DIAGNOSIS — T849XXA Unspecified complication of internal orthopedic prosthetic device, implant and graft, initial encounter: Secondary | ICD-10-CM | POA: Diagnosis not present

## 2022-06-06 DIAGNOSIS — M7742 Metatarsalgia, left foot: Secondary | ICD-10-CM | POA: Diagnosis not present

## 2022-06-06 DIAGNOSIS — M2022 Hallux rigidus, left foot: Secondary | ICD-10-CM | POA: Diagnosis not present

## 2022-06-07 ENCOUNTER — Other Ambulatory Visit: Payer: Self-pay | Admitting: Student

## 2022-06-07 DIAGNOSIS — M2022 Hallux rigidus, left foot: Secondary | ICD-10-CM

## 2022-06-09 DIAGNOSIS — E782 Mixed hyperlipidemia: Secondary | ICD-10-CM | POA: Diagnosis not present

## 2022-06-09 LAB — LIPID PANEL
Chol/HDL Ratio: 2.2 ratio (ref 0.0–4.4)
Cholesterol, Total: 146 mg/dL (ref 100–199)
HDL: 67 mg/dL (ref >39)
LDL Chol Calc (NIH): 62 mg/dL (ref 0–99)
Triglycerides: 90 mg/dL (ref 0–149)
VLDL Cholesterol Cal: 17 mg/dL (ref 5–40)

## 2022-06-13 ENCOUNTER — Ambulatory Visit: Payer: Medicare Other

## 2022-06-13 DIAGNOSIS — R002 Palpitations: Secondary | ICD-10-CM

## 2022-06-14 LAB — CUP PACEART REMOTE DEVICE CHECK
Date Time Interrogation Session: 20240209231140
Implantable Pulse Generator Implant Date: 20230615

## 2022-06-20 NOTE — Progress Notes (Signed)
Carelink Summary Report / Loop Recorder 

## 2022-07-02 IMAGING — CT CT CARDIAC CORONARY ARTERY CALCIUM SCORE
3 series · 14 of 20 positions shown, 15 images · non-contrast
Comparison: 10/13/2003 chest radiograph.
COMPARISON: 10/13/2003 chest radiograph.

Addendum:
EXAM:
OVER-READ INTERPRETATION  CT CHEST

The following report is an over-read performed by radiologist Dr.
Benczik Haraszthy [REDACTED] on 11/24/2020. This over-read
does not include interpretation of cardiac or coronary anatomy or
pathology. The calcium score interpretation by the cardiologist is
attached.
CLINICAL DATA: Cardiovascular Disease Risk stratification
Coronary Calcium Score
TECHNIQUE: A gated, non-contrast computed tomography scan of the heart was
performed using 3mm slice thickness. Axial images were analyzed on a
dedicated workstation. Calcium scoring of the coronary arteries was
performed using the Agatston method.

[Series 2: casc 3.0 bv41 2 bestdiast 71 % · axial · 0.43mm/px · z∈[-214,-134]mm · 4 of 45 slices shown, 5 images]
[im 9/45  vessel]
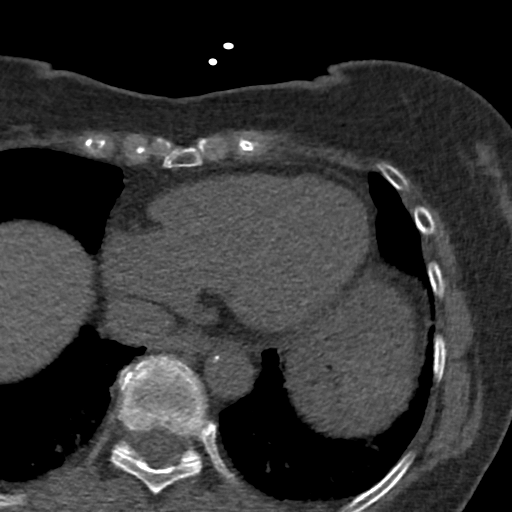
[im 9/45  lung]
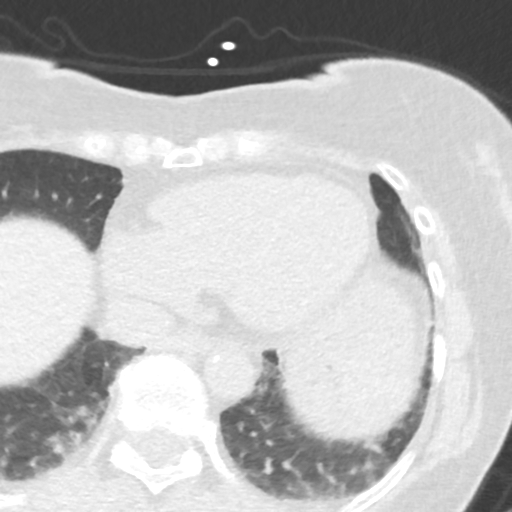
[im 18/45  vessel]
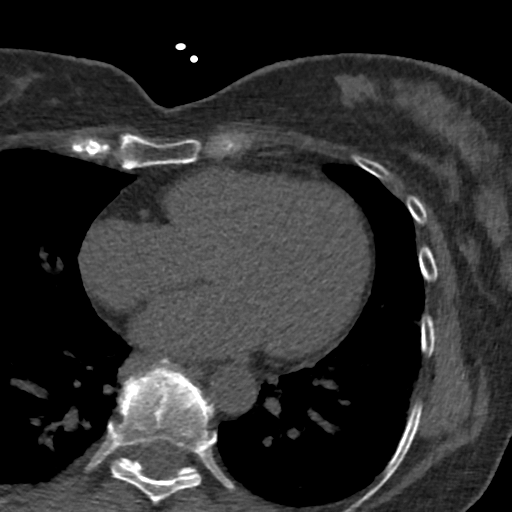
[im 27/45  vessel]
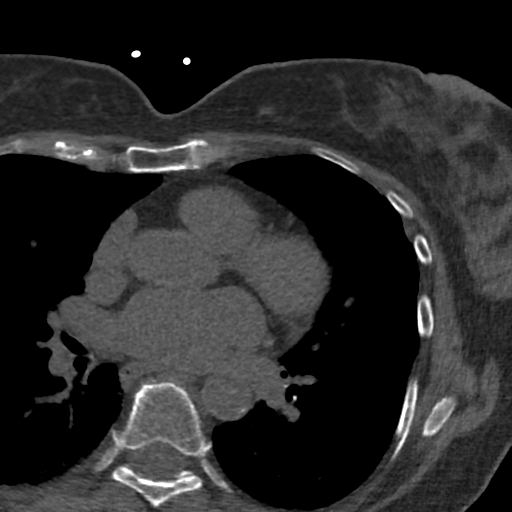
[im 36/45  vessel]
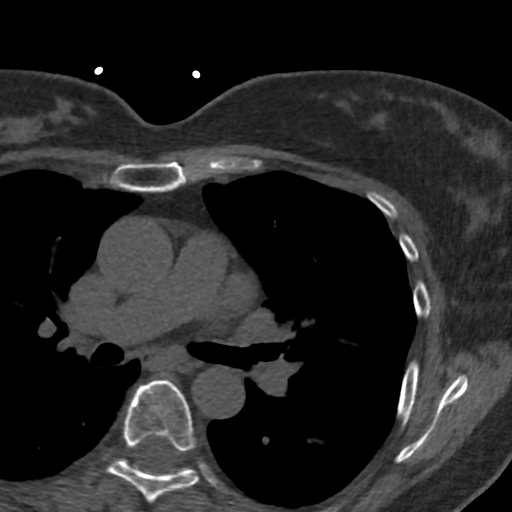

[Series 3: lung 70 % · axial · 0.62mm/px · z∈[-218,-130]mm · 5 of 45 slices shown]
[im 8/45  lung]
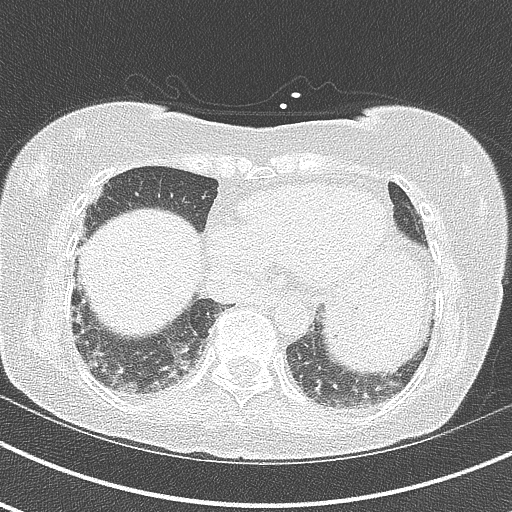
[im 15/45  lung]
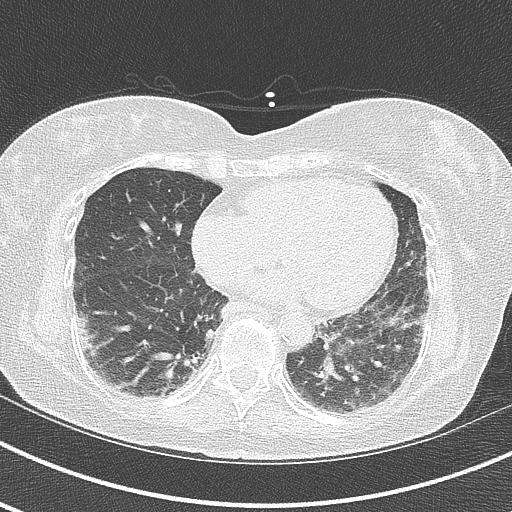
[im 23/45  lung]
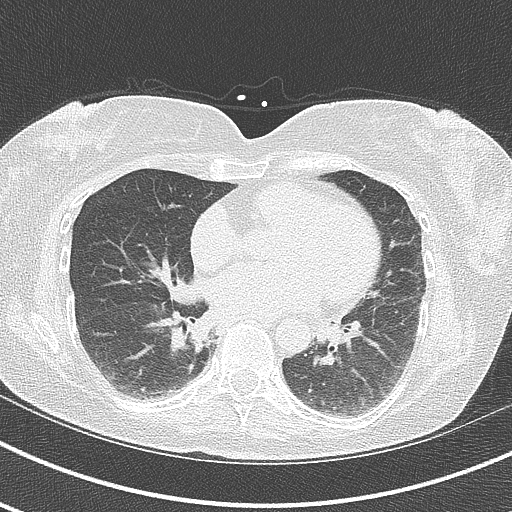
[im 30/45  lung]
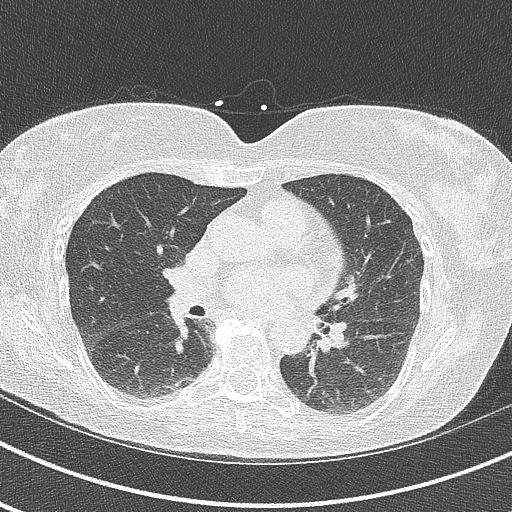
[im 37/45  lung]
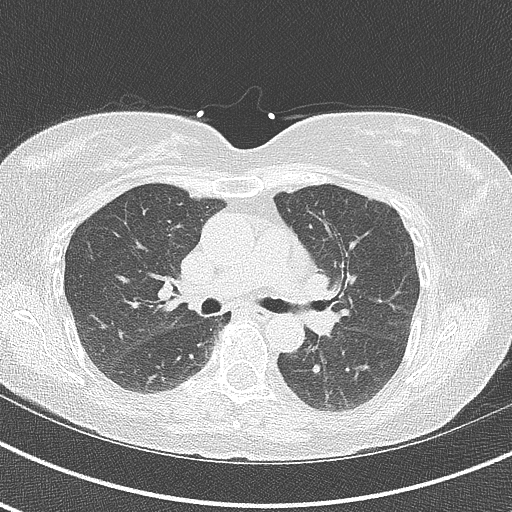

[Series 4: lung st 70 % · axial · 0.62mm/px · z∈[-218,-130]mm · 5 of 45 slices shown]
[im 8/45  lung]
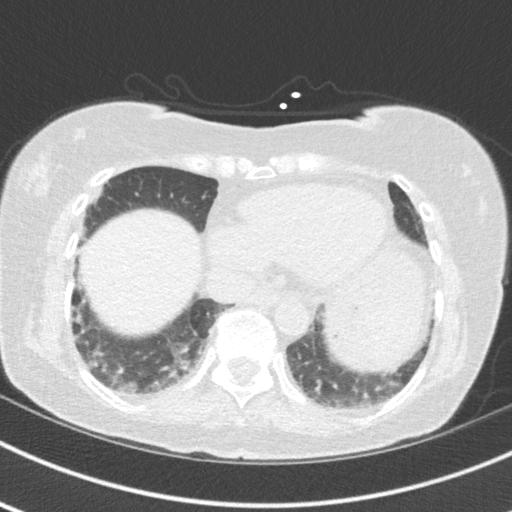
[im 15/45  lung]
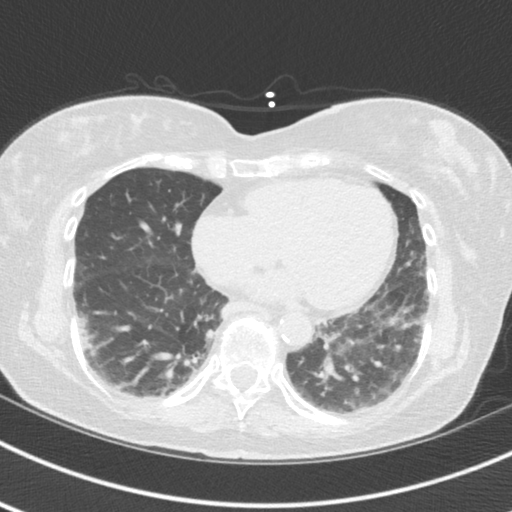
[im 23/45  lung]
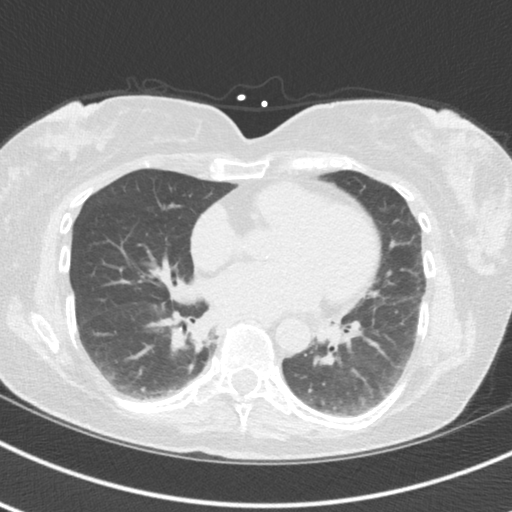
[im 30/45  lung]
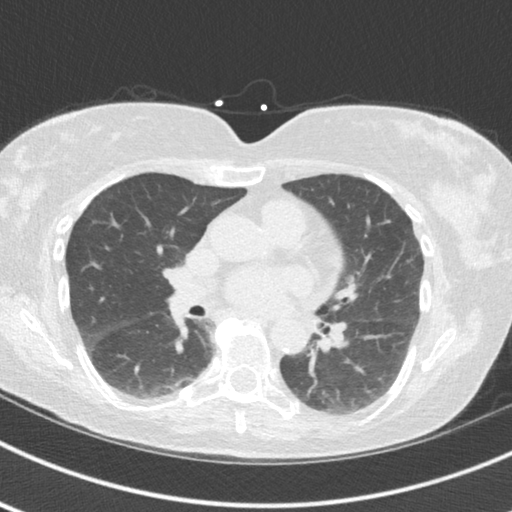
[im 37/45  lung]
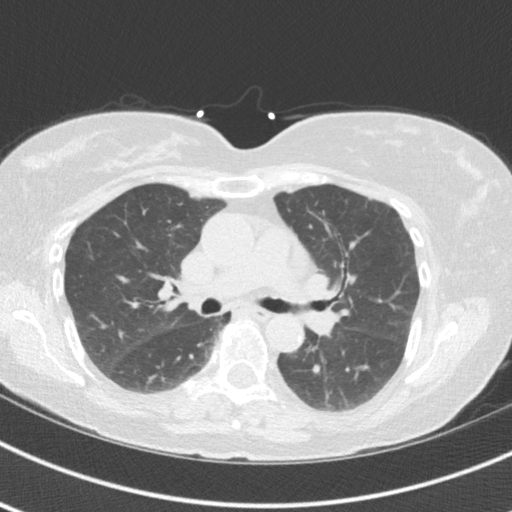

[14 of 20 positions shown; findings below may reference images not displayed]

FINDINGS: Vascular: Aortic atherosclerosis.

Mediastinum/Nodes: No imaged thoracic adenopathy.

Lungs/Pleura: No pleural fluid. Mild peripheral and basilar
predominant interstitial opacities.

Upper Abdomen: Normal imaged portions of the liver, stomach.

Musculoskeletal: No acute osseous abnormality. Midthoracic
spondylosis.
IMPRESSION: 1. No acute findings in the imaged extracardiac chest.
2. Aortic Atherosclerosis (KRJWX-SCF.F).
3. Peripheral predominant mild interstitial opacities, possibly post
infectious/inflammatory scarring. If there are chronic pulmonary
symptoms, interstitial lung disease could look similar. If any given
history, consider pulmonology consultation.
FINDINGS: Coronary arteries: Normal origins.

Coronary Calcium Score:

Left main: 0

Left anterior descending artery: 0

Left circumflex artery: 0

Right coronary artery: 12

Total: 12

Percentile: 40

Pericardium: Normal.

Ascending Aorta: Normal caliber.

Non-cardiac: See separate report from [REDACTED].
IMPRESSION: Coronary calcium score of 12. This was 40th percentile for age-,
race-, and sex-matched controls.



If CAC=0, it is reasonable to withhold statin therapy and reassess
in 5 to 10 years, as long as higher risk conditions are absent
(diabetes mellitus, family history of premature CHD in first degree
relatives (males <55 years; females <65 years), cigarette smoking,
or LDL >=190 mg/dL).

If CAC is 1 to 99, it is reasonable to initiate statin therapy for
patients >=55 years of age.

If CAC is >=100 or >=75th percentile, it is reasonable to initiate
statin therapy at any age.

Cardiology referral should be considered for patients with CAC
scores >=400 or >=75th percentile.

*4814 AHA/ACC/AACVPR/AAPA/ABC/JUMPER/SHAWNA/COBIAN/Ge/CEEJAY/SAGGESE/KAUNO
Guideline on the Management of Blood Cholesterol: A Report of the
American College of Cardiology/American Heart Association Task Force
on Clinical Practice Guidelines. J Am Coll Cardiol.
1024;73(24):6481-6653.

*** End of Addendum ***
EXAM:
OVER-READ INTERPRETATION  CT CHEST

The following report is an over-read performed by radiologist Dr.
Benczik Haraszthy [REDACTED] on 11/24/2020. This over-read
does not include interpretation of cardiac or coronary anatomy or
pathology. The calcium score interpretation by the cardiologist is
attached.
FINDINGS: Vascular: Aortic atherosclerosis.

Mediastinum/Nodes: No imaged thoracic adenopathy.

Lungs/Pleura: No pleural fluid. Mild peripheral and basilar
predominant interstitial opacities.

Upper Abdomen: Normal imaged portions of the liver, stomach.

Musculoskeletal: No acute osseous abnormality. Midthoracic
spondylosis.
IMPRESSION: 1. No acute findings in the imaged extracardiac chest.
2. Aortic Atherosclerosis (KRJWX-SCF.F).
3. Peripheral predominant mild interstitial opacities, possibly post
infectious/inflammatory scarring. If there are chronic pulmonary
symptoms, interstitial lung disease could look similar. If any given
history, consider pulmonology consultation.

## 2022-07-04 ENCOUNTER — Ambulatory Visit
Admission: RE | Admit: 2022-07-04 | Discharge: 2022-07-04 | Disposition: A | Discharge status: Home / Self Care | Payer: Medicare Other | Source: Ambulatory Visit | Attending: Student | Admitting: Student

## 2022-07-04 DIAGNOSIS — M7732 Calcaneal spur, left foot: Secondary | ICD-10-CM | POA: Diagnosis not present

## 2022-07-04 DIAGNOSIS — Z981 Arthrodesis status: Secondary | ICD-10-CM | POA: Diagnosis not present

## 2022-07-04 DIAGNOSIS — M2022 Hallux rigidus, left foot: Secondary | ICD-10-CM

## 2022-07-06 DIAGNOSIS — Z0000 Encounter for general adult medical examination without abnormal findings: Secondary | ICD-10-CM | POA: Diagnosis not present

## 2022-07-06 DIAGNOSIS — E559 Vitamin D deficiency, unspecified: Secondary | ICD-10-CM | POA: Diagnosis not present

## 2022-07-06 DIAGNOSIS — Z79899 Other long term (current) drug therapy: Secondary | ICD-10-CM | POA: Diagnosis not present

## 2022-07-06 DIAGNOSIS — Z658 Other specified problems related to psychosocial circumstances: Secondary | ICD-10-CM | POA: Diagnosis not present

## 2022-07-06 DIAGNOSIS — Z1331 Encounter for screening for depression: Secondary | ICD-10-CM | POA: Diagnosis not present

## 2022-07-06 DIAGNOSIS — M1712 Unilateral primary osteoarthritis, left knee: Secondary | ICD-10-CM | POA: Diagnosis not present

## 2022-07-06 DIAGNOSIS — K219 Gastro-esophageal reflux disease without esophagitis: Secondary | ICD-10-CM | POA: Diagnosis not present

## 2022-07-06 DIAGNOSIS — I10 Essential (primary) hypertension: Secondary | ICD-10-CM | POA: Diagnosis not present

## 2022-07-06 DIAGNOSIS — I351 Nonrheumatic aortic (valve) insufficiency: Secondary | ICD-10-CM | POA: Diagnosis not present

## 2022-07-06 DIAGNOSIS — I471 Supraventricular tachycardia, unspecified: Secondary | ICD-10-CM | POA: Diagnosis not present

## 2022-07-06 DIAGNOSIS — L659 Nonscarring hair loss, unspecified: Secondary | ICD-10-CM | POA: Diagnosis not present

## 2022-07-06 DIAGNOSIS — E782 Mixed hyperlipidemia: Secondary | ICD-10-CM | POA: Diagnosis not present

## 2022-07-12 DIAGNOSIS — K080 Exfoliation of teeth due to systemic causes: Secondary | ICD-10-CM | POA: Diagnosis not present

## 2022-07-18 ENCOUNTER — Ambulatory Visit (INDEPENDENT_AMBULATORY_CARE_PROVIDER_SITE_OTHER): Payer: Medicare Other

## 2022-07-18 ENCOUNTER — Telehealth: Payer: Self-pay | Admitting: *Deleted

## 2022-07-18 DIAGNOSIS — R002 Palpitations: Secondary | ICD-10-CM | POA: Diagnosis not present

## 2022-07-18 DIAGNOSIS — M7742 Metatarsalgia, left foot: Secondary | ICD-10-CM | POA: Diagnosis not present

## 2022-07-18 DIAGNOSIS — T849XXA Unspecified complication of internal orthopedic prosthetic device, implant and graft, initial encounter: Secondary | ICD-10-CM | POA: Diagnosis not present

## 2022-07-18 DIAGNOSIS — M2042 Other hammer toe(s) (acquired), left foot: Secondary | ICD-10-CM | POA: Diagnosis not present

## 2022-07-18 DIAGNOSIS — M21612 Bunion of left foot: Secondary | ICD-10-CM | POA: Diagnosis not present

## 2022-07-18 NOTE — Telephone Encounter (Signed)
   Pre-operative Risk Assessment    Patient Name: Shannon [name]  DOB: 07/10/1945 MRN: [id number]     Request for Surgical Clearance    Procedure:   REMOVAL OF DEEP IMPLANTS LEFT 1ST MT AND HALLUX; REVISION LEFT MPJ ARTHRODESIS WITH POSSIBLE CALC AUTOGRAFT; LEFT 2ND MT WEIL OSTEOTOMY HAMMERTOE CORRECTION  Date of Surgery:  Clearance TBD                                 Surgeon:  DR. Jenny Reichmann HEWITT Surgeon's Group or Practice Name:  [facility] Phone number:  [phone] ATTN: [place] Fax number:  [phone]   Type of Clearance Requested:   - Medical ; NO MEDICATIONS LISTED AS NEEDING TO BE HELD   Type of Anesthesia:  General    Additional requests/questions:    Jiles Prows   07/18/2022, 5:41 PM

## 2022-07-19 ENCOUNTER — Telehealth: Payer: Self-pay

## 2022-07-19 DIAGNOSIS — L638 Other alopecia areata: Secondary | ICD-10-CM | POA: Diagnosis not present

## 2022-07-19 DIAGNOSIS — L920 Granuloma annulare: Secondary | ICD-10-CM | POA: Diagnosis not present

## 2022-07-19 NOTE — Telephone Encounter (Signed)
  Patient Consent for Virtual Visit        Shannon [name] has provided verbal consent on 07/19/2022 for a virtual visit (video or telephone).   CONSENT FOR VIRTUAL VISIT FOR:  Shannon [name]  By participating in this virtual visit I agree to the following:  I hereby voluntarily request, consent and authorize [facility] and its employed or contracted physicians, physician assistants, nurse practitioners or other licensed health care professionals (the Practitioner), to provide me with telemedicine health care services (the "Services") as deemed necessary by the treating Practitioner. I acknowledge and consent to receive the Services by the Practitioner via telemedicine. I understand that the telemedicine visit will involve communicating with the Practitioner through live audiovisual communication technology and the disclosure of certain medical information by electronic transmission. I acknowledge that I have been given the opportunity to request an in-person assessment or other available alternative prior to the telemedicine visit and am voluntarily participating in the telemedicine visit.  I understand that I have the right to withhold or withdraw my consent to the use of telemedicine in the course of my care at any time, without affecting my right to future care or treatment, and that the Practitioner or I may terminate the telemedicine visit at any time. I understand that I have the right to inspect all information obtained and/or recorded in the course of the telemedicine visit and may receive copies of available information for a reasonable fee.  I understand that some of the potential risks of receiving the Services via telemedicine include:  Delay or interruption in medical evaluation due to technological equipment failure or disruption; Information transmitted may not be sufficient (e.g. poor resolution of images) to allow for appropriate medical decision making by the  Practitioner; and/or  In rare instances, security protocols could fail, causing a breach of personal health information.  Furthermore, I acknowledge that it is my responsibility to provide information about my medical history, conditions and care that is complete and accurate to the best of my ability. I acknowledge that Practitioner's advice, recommendations, and/or decision may be based on factors not within their control, such as incomplete or inaccurate data provided by me or distortions of diagnostic images or specimens that may result from electronic transmissions. I understand that the practice of medicine is not an exact science and that Practitioner makes no warranties or guarantees regarding treatment outcomes. I acknowledge that a copy of this consent can be made available to me via my patient portal ([facility]), or I can request a printed copy by calling the office of [facility].    I understand that my insurance will be billed for this visit.   I have read or had this consent read to me. I understand the contents of this consent, which adequately explains the benefits and risks of the Services being provided via telemedicine.  I have been provided ample opportunity to ask questions regarding this consent and the Services and have had my questions answered to my satisfaction. I give my informed consent for the services to be provided through the use of telemedicine in my medical care

## 2022-07-19 NOTE — Telephone Encounter (Signed)
   Name: Shannon [name]  DOB: 04/16/1946  MRN: [id number]  Primary Cardiologist: None   Preoperative team, please contact this patient and set up a phone call appointment for further preoperative risk assessment. Please obtain consent and complete medication review. Thank you for your help.    Mayra Reel, NP 07/19/2022, 10:47 AM [facility]

## 2022-07-19 NOTE — Telephone Encounter (Signed)
Spoke with patient who is agreeable to do a tele visit on 4/3 at 9:20 am. Med rec and consent have been completed.

## 2022-07-19 NOTE — Addendum Note (Signed)
Addended by: Jacinta Shoe on: 07/19/2022 11:03 AM   Modules accepted: Orders

## 2022-07-20 ENCOUNTER — Telehealth: Payer: Self-pay | Admitting: Cardiovascular Disease

## 2022-07-20 DIAGNOSIS — E785 Hyperlipidemia, unspecified: Secondary | ICD-10-CM

## 2022-07-20 LAB — CUP PACEART REMOTE DEVICE CHECK
Date Time Interrogation Session: 20240317231353
Implantable Pulse Generator Implant Date: 20230615

## 2022-07-20 NOTE — Telephone Encounter (Signed)
Pt c/o medication issue:  1. Name of Medication: ezetimibe (ZETIA) 10 MG tablet   2. How are you currently taking this medication (dosage and times per day)?    3. Are you having a reaction (difficulty breathing--STAT)? no  4. What is your medication issue? Patient started to experience hair loss. Calling to see what other medication. Please advise

## 2022-07-20 NOTE — Telephone Encounter (Addendum)
Patient states she is having hair loss since she has been taking Zetia. She stated when she saw her dermatologist she mentioned her hair loss to the dermatologist he stated it can be from the [facility]. She stated then she spoke to the pharmacist and she stated Zetia can cause hair loss.   She would like to know if you can switch her to another medication

## 2022-07-20 NOTE — Telephone Encounter (Signed)
Patient is aware of lipid clinic referral.  She would like to know if she can stop Zetia now or would you like for her to wait until she sees PharmD

## 2022-07-20 NOTE — Telephone Encounter (Signed)
Patient will stop Zetia today. Verbalized understanding.

## 2022-07-20 NOTE — Telephone Encounter (Signed)
Ok to stop zetia now

## 2022-07-21 NOTE — Progress Notes (Signed)
Patient ID: Shannon [name]                 DOB: 17-May-1945                    MRN: [id number]      HPI: Shannon [name] is a 77 y.o. female patient referred to lipid clinic by  Dr. Gwenlyn Found. PMH is significant for HTN, nonsustained ventricular tachycardia, SVT, HLD, coronary calcium score 12 (40th percentile for age, race, and sex matched controls) in 10/2020.   At last visit on 03/02/2022 with Dr. Gwenlyn Found, patient reported taking rosuvastatin 40mg  and ezetimibe 10mg  was started. Patient reported 07/20/22 she was having hair loss since starting ezetimibe. Ezetimibe was stopped and referral to PharmD Clinic was placed. Last LDL-C from 06/09/2022 was 62 (on rosuvastatin and ezetimibe), LDL-C from 03/02/2022 was 91 (on rosuvastatin).   At today's visit patient reported starting ezetimibe in November and for the past 5 weeks has experienced increasing hair loss. Dermatology suggested it could be due to ezetimibe. Patient reported stopping ezetimibe 2 days ago, so she has not noticed a change as of yet. They also prescribed topical Rogaine for hair growth which she has not started yet. Patient reported exercise is limited at this time due to her left foot bunion. Plans to have surgery in April. Reviewed options for additional LDL cholesterol lowering including PCSK-9 inhibitors. Discussed mechanisms of action, dosing, side effects and potential decreases in LDL cholesterol. Also reviewed cost information: Repatha is $45/month or $90/3 months. Patient expressed hesitancy with giving injections - wants to discuss with her husband before making a decision on starting therapy.   Current Medications: rosuvastatin 40mg  daily Intolerances: ezetimibe 10mg  (hair loss) Risk Factors: elevated CAC, HTN, age >23 LDL-C goal: < 70 ApoB goal: < 80  Diet:  Breakfast: cookies made of whole oats and crandberries  Dinner: meat, few vegetables (chicken, fish) Drinks: diet coke (1 can day), one cup coffee, water   Exercise:  limited due to bunion. Does housework, grocery shopping   Family History: There is a family history for heart disease with a father that had myocardial infarction.   Social History: Never smoked, occasional alcohol use, no drug use  Labs: 06/09/22: TC 146, TG 90, HDL 67, LDL 62 (rosuvastatin 40mg  daily, ezetimibe 10mg  daily) 03/02/2022: TC 193, TG 92, HDL 86, LDL 91 (rosuvastatin 10mg  daily)  Past Medical History:  Diagnosis Date   Anxiety    Arthritis    Heart murmur    Hypertension    Palpitations     Current Outpatient Medications on File Prior to Visit  Medication Sig Dispense Refill   ALPRAZolam (XANAX) 0.25 MG tablet Take 0.125-0.25 mg by mouth at bedtime.      bisoprolol (ZEBETA) 5 MG tablet TAKE 1 TABLET(5 MG) BY MOUTH AT BEDTIME 90 tablet 2   Cholecalciferol (VITAMIN D3) 50 MCG (2000 UT) TABS Take 2,000 mg by mouth daily.     ibuprofen (ADVIL) 400 MG tablet Take 400 mg by mouth every 6 (six) hours as needed.     losartan (COZAAR) 50 MG tablet Take 1 tablet by mouth daily.     rosuvastatin (CRESTOR) 40 MG tablet Take 1 tablet (40 mg total) by mouth daily. 90 tablet 4   verapamil (CALAN-SR) 120 MG CR tablet TAKE 1 TABLET(120 MG) BY MOUTH AT BEDTIME 90 tablet 3   Current Facility-Administered Medications on File Prior to Visit  Medication Dose Route Frequency Provider Last Rate Last  Admin   technetium tetrofosmin (TC-MYOVIEW) injection 99991111 millicurie  99991111 millicurie Intravenous Once PRN O'Neal, Cassie Freer, MD        Allergies  Allergen Reactions   Cephalexin Hives and Other (See Comments)    Pt received ancef 07/02/2020 and did well   Morphine Other (See Comments) and Palpitations   Penicillins Hives    Pt received ancef on 07/02/2020 with no reaction   Epinephrine Palpitations and Other (See Comments)    Other reaction(s): palpitations   Hydrochlorothiazide     Other reaction(s): weakness   Hydrocodone Hives   Oxycodone Hcl Hives   Pseudoephedrine Hcl     Other  reaction(s): increased hyperactivity   Zolpidem Tartrate Diarrhea   Norco [Hydrocodone-Acetaminophen] Hives   Oxycodone Hives   Penicillin V Potassium Rash    Assessment/Plan:  1. Hyperlipidemia -  Hyperlipidemia Assessment: Patient reported adherence with rosuvastatin 40mg  Last LDL-C from 06/09/22 on rosuvastatin and ezetimibe was 62, LDL-C from 03/02/22 on just rosuvastatin was 91 Patient reported starting ezetimibe in Nov and experiencing increasing hair loss the past 5 weeks  Stopped ezetimibe 2 days ago - has not noticed any changes Patient reported exercise is limited at this time due to left foot bunion Discussed LDL-C goal <70 and subsequent CV risk reduction  Discussed additional LDL lowering medications including Repatha: $45/month, $90/3 months.   Plan:  Will submit prior authorization for Repatha  Continue rosuvastatin 40mg  daily Encouraged patient to continue diet and exercise for LDL lowering and CV risk reduction  Will plan to follow-up via telephone once Repatha is approved to discuss potential initiation    Thank you,  Wallene Huh, PharmD Candidate   Megan E. Supple, PharmD, BCACP, [facility]. [address], [place], [place] [address] Phone: [phone]; Fax: [phone] 07/22/2022 12:36 PM

## 2022-07-22 ENCOUNTER — Telehealth: Payer: Self-pay

## 2022-07-22 ENCOUNTER — Ambulatory Visit: Payer: Medicare Other | Attending: Cardiovascular Disease | Admitting: Pharmacist

## 2022-07-22 DIAGNOSIS — E782 Mixed hyperlipidemia: Secondary | ICD-10-CM

## 2022-07-22 NOTE — Telephone Encounter (Signed)
Discussed with patient Repatha PA has been approved through 07/22/2023. Patient would like to take some additional time to think about initiating therapy. Provided contact information for when she has made her decision.

## 2022-07-22 NOTE — Assessment & Plan Note (Addendum)
Assessment: Patient reported adherence with rosuvastatin 40mg  Last LDL-C from 06/09/22 on rosuvastatin and ezetimibe was 62, LDL-C from 03/02/22 on just rosuvastatin was 91 Patient reported starting ezetimibe in Nov and experiencing increasing hair loss the past 5 weeks  Stopped ezetimibe 2 days ago - has not noticed any changes Patient reported exercise is limited at this time due to left foot bunion Discussed LDL-C goal <70 and subsequent CV risk reduction  Discussed additional LDL lowering medications including Repatha: $45/month, $90/3 months.   Plan:  Will submit prior authorization for Repatha  Continue rosuvastatin 40mg  daily Encouraged patient to continue diet and exercise for LDL lowering and CV risk reduction  Will plan to follow-up via telephone once Repatha is approved to discuss potential initiation

## 2022-07-22 NOTE — Patient Instructions (Addendum)
Your LDL cholesterol goal is < 70  Continue taking your rosuvastatin  I will submit information to your insurance for Repatha and let you know when I hear back.    Repatha is a subcutaneous injection given once every 2 weeks in the fatty tissue of your stomach or upper outer thigh. Store the medication in the fridge. You can let your dose warm up to room temperature for 30 minutes before injecting if you prefer. Repatha will lower your LDL cholesterol by 60% and helps to lower your chance of having a heart attack or stroke.

## 2022-07-28 NOTE — Progress Notes (Signed)
Carelink Summary Report / Loop Recorder 

## 2022-07-29 ENCOUNTER — Telehealth: Payer: Self-pay

## 2022-07-29 NOTE — Telephone Encounter (Signed)
   Patient Name: Shannon [name]  DOB: 04-Dec-1945 MRN: [id number]  Primary Cardiologist: None  Chart reviewed as part of pre-operative protocol coverage.  This appears to be a duplicate request.  Patient has an appointment for a telephone visit for preop clearance already scheduled for 08/03/2022.  Clearance will be addressed at that time.  I will route this recommendation to the requesting party via Epic fax function and remove from pre-op pool.  Please call with questions.  Lenna Sciara, NP 07/29/2022, 11:14 AM

## 2022-07-29 NOTE — Telephone Encounter (Signed)
   Pre-operative Risk Assessment    Patient Name: Shannon [name]  DOB: [birth year] MRN: [id number]      Request for Surgical Clearance    Procedure:   Removal of deep implants of 1st MT and hallux; revision left hallux mpj arthrodesis with possible calc autograft; left 2nd weil osteotomy and possible hammertoe correction  Date of Surgery:  Clearance TBD                                 Surgeon:  Dr. Wylene Simmer Surgeon's Group or Practice Name:  [facility] Phone number:  [phone] Fax number:  [phone] Attn: Vernie Ammons   Type of Clearance Requested:   - Medical    Type of Anesthesia:  General    Additional requests/questions:    Tana Conch   07/29/2022, 10:58 AM

## 2022-08-01 NOTE — Progress Notes (Unsigned)
Virtual Visit via Telephone Note   Because of Shannon [name]'s co-morbid illnesses, she is at least at moderate risk for complications without adequate follow up.  This format is felt to be most appropriate for this patient at this time.  The patient did not have access to video technology/had technical difficulties with video requiring transitioning to audio format only (telephone).  All issues noted in this document were discussed and addressed.  No physical exam could be performed with this format.  Please refer to the patient's chart for her consent to telehealth for [facility].  Evaluation Performed:  Preoperative cardiovascular risk assessment _____________   Date:  08/01/2022   Patient ID:  Shannon [name], DOB [birth year], MRN [id number] Patient Location:  Home Provider location:   Office  Primary Care Provider:  Kathalene Frames, MD Primary Cardiologist:  None  Chief Complaint / Patient Profile   77 y.o. y/o female with a h/o HTN, NSVT, aortic insufficiency who is pending removal of implant and hammertoe correction procedure and presents today for telephonic preoperative cardiovascular risk assessment.  History of Present Illness    Shannon [name] is a 77 y.o. female who presents via audio/video conferencing for a telehealth visit today.  Pt was last seen in cardiology clinic on 03/02/2022 by Dr. Gwenlyn Found.  At that time Shannon [name] was doing well with no recurrent episodes of NSVT and BP was well-controlled. 2D echo was rechecked in January that showed no change from previous study. The patient is now pending procedure as outlined above. Since her last visit, she has no experienced any cardiac changes since her previous visit.  She recently developed adverse reactions with her Zetia and this medication was discontinued.  She has a loop recorder placed and monthly monitoring showed no evidence of palpitations or arrhythmia.  She denies chest pain, shortness of  breath, lower extremity edema, fatigue, palpitations, melena, hematuria, hemoptysis, diaphoresis, weakness, presyncope, syncope, orthopnea, and PND.   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   Procedure: TOTAL KNEE ARTHROPLASTY;  Surgeon: Paralee Cancel, MD;  Location: [facility];  Service: Orthopedics;  Laterality: Left;  70 mins   WRIST SURGERY Right     Allergies  Allergies  Allergen Reactions   Cephalexin Hives and Other (See Comments)    Pt received ancef 07/02/2020 and did well   Morphine Other (See Comments) and Palpitations   Penicillins Hives    Pt received ancef on 07/02/2020 with no reaction   Epinephrine Palpitations and Other (See Comments)    Other reaction(s): palpitations   Hydrochlorothiazide     Other reaction(s): weakness   Hydrocodone Hives   Oxycodone Hcl Hives   Pseudoephedrine Hcl     Other reaction(s): increased hyperactivity   Zolpidem Tartrate Diarrhea   Norco [Hydrocodone-Acetaminophen] Hives   Oxycodone Hives   Penicillin  V Potassium Rash    Home Medications    Prior to Admission medications   Medication Sig Start Date End Date Taking? Authorizing Provider  ALPRAZolam (XANAX) 0.25 MG tablet Take 0.125-0.25 mg by mouth at bedtime.  04/03/19   [provider]  bisoprolol (ZEBETA) 5 MG tablet TAKE 1 TABLET(5 MG) BY MOUTH AT BEDTIME 01/10/22   Shirley Friar, PA-C  Cholecalciferol  (VITAMIN D3) 50 MCG (2000 UT) TABS Take 2,000 mg by mouth daily.    [provider]  ibuprofen (ADVIL) 400 MG tablet Take 400 mg by mouth every 6 (six) hours as needed.    [provider]  losartan (COZAAR) 50 MG tablet Take 1 tablet by mouth daily.    [provider]  rosuvastatin (CRESTOR) 40 MG tablet Take 1 tablet (40 mg total) by mouth daily. 03/02/22   Lorretta Harp, MD  verapamil (CALAN-SR) 120 MG CR tablet TAKE 1 TABLET(120 MG) BY MOUTH AT BEDTIME 10/25/21   Lorretta Harp, MD    Physical Exam    Vital Signs:  Shannon [name] does not have vital signs available for review today.  Given telephonic nature of communication, physical exam is limited. AAOx3. NAD. Normal affect.  Speech and respirations are unlabored.  Accessory Clinical Findings    None  Assessment & Plan    1.  Preoperative Cardiovascular Risk Assessment:  Shannon [name] perioperative risk of a major cardiac event is 0.4% according to the Revised Cardiac Risk Index (RCRI).  Therefore, she is at low risk for perioperative complications.   Her functional capacity is fair at 5.07 METs according to the Duke Activity Status Index (DASI).  Recommendations: According to ACC/AHA guidelines, no further cardiovascular testing needed.  The patient may proceed to surgery at acceptable risk.   Antiplatelet and/or Anticoagulation Recommendations:  No medications requested  A copy of this note will be routed to requesting surgeon.  Time:   Today, I have spent 8 minutes with the patient with telehealth technology discussing medical history, symptoms, and management plan.     Mable Fill, Marissa Nestle, NP  08/01/2022, 1:32 PM

## 2022-08-03 ENCOUNTER — Telehealth: Payer: Self-pay

## 2022-08-03 ENCOUNTER — Ambulatory Visit: Payer: Medicare Other | Attending: Cardiology

## 2022-08-03 DIAGNOSIS — Z01810 Encounter for preprocedural cardiovascular examination: Secondary | ICD-10-CM

## 2022-08-03 NOTE — Telephone Encounter (Signed)
   Pre-operative Risk Assessment    Patient Name: Shannon [name]  DOB: 03-28-1946 MRN: [id number]      Request for Surgical Clearance    Procedure:   COLONOSCOPY   Date of Surgery:  Clearance 08/10/22                                 Surgeon:  DR. [facility]   Surgeon's Group or Practice Name:  [place] Phone number:  [phone] Fax number:  [phone]   Type of Clearance Requested:   - Medical    Type of Anesthesia:  Not Indicated   Additional requests/questions:    SignedJacinta Shoe   08/03/2022, 1:11 PM

## 2022-08-10 DIAGNOSIS — K5730 Diverticulosis of large intestine without perforation or abscess without bleeding: Secondary | ICD-10-CM | POA: Diagnosis not present

## 2022-08-10 DIAGNOSIS — Z09 Encounter for follow-up examination after completed treatment for conditions other than malignant neoplasm: Secondary | ICD-10-CM | POA: Diagnosis not present

## 2022-08-10 DIAGNOSIS — Z86010 Personal history of colonic polyps: Secondary | ICD-10-CM | POA: Diagnosis not present

## 2022-08-10 DIAGNOSIS — D123 Benign neoplasm of transverse colon: Secondary | ICD-10-CM | POA: Diagnosis not present

## 2022-08-10 DIAGNOSIS — D128 Benign neoplasm of rectum: Secondary | ICD-10-CM | POA: Diagnosis not present

## 2022-08-10 DIAGNOSIS — D125 Benign neoplasm of sigmoid colon: Secondary | ICD-10-CM | POA: Diagnosis not present

## 2022-08-10 DIAGNOSIS — K648 Other hemorrhoids: Secondary | ICD-10-CM | POA: Diagnosis not present

## 2022-08-10 DIAGNOSIS — K644 Residual hemorrhoidal skin tags: Secondary | ICD-10-CM | POA: Diagnosis not present

## 2022-08-12 DIAGNOSIS — D128 Benign neoplasm of rectum: Secondary | ICD-10-CM | POA: Diagnosis not present

## 2022-08-12 DIAGNOSIS — K1121 Acute sialoadenitis: Secondary | ICD-10-CM | POA: Diagnosis not present

## 2022-08-12 DIAGNOSIS — D123 Benign neoplasm of transverse colon: Secondary | ICD-10-CM | POA: Diagnosis not present

## 2022-08-15 DIAGNOSIS — K1121 Acute sialoadenitis: Secondary | ICD-10-CM | POA: Diagnosis not present

## 2022-08-22 ENCOUNTER — Ambulatory Visit (INDEPENDENT_AMBULATORY_CARE_PROVIDER_SITE_OTHER): Payer: Medicare Other

## 2022-08-22 DIAGNOSIS — R002 Palpitations: Secondary | ICD-10-CM | POA: Diagnosis not present

## 2022-08-23 DIAGNOSIS — M545 Low back pain, unspecified: Secondary | ICD-10-CM | POA: Diagnosis not present

## 2022-08-23 DIAGNOSIS — M791 Myalgia, unspecified site: Secondary | ICD-10-CM | POA: Diagnosis not present

## 2022-08-23 LAB — CUP PACEART REMOTE DEVICE CHECK
Date Time Interrogation Session: 20240419230646
Implantable Pulse Generator Implant Date: 20230615

## 2022-08-29 NOTE — Progress Notes (Signed)
Carelink Summary Report / Loop Recorder 

## 2022-09-20 DIAGNOSIS — G8918 Other acute postprocedural pain: Secondary | ICD-10-CM | POA: Diagnosis not present

## 2022-09-20 DIAGNOSIS — S92312K Displaced fracture of first metatarsal bone, left foot, subsequent encounter for fracture with nonunion: Secondary | ICD-10-CM | POA: Diagnosis not present

## 2022-09-20 DIAGNOSIS — T8484XA Pain due to internal orthopedic prosthetic devices, implants and grafts, initial encounter: Secondary | ICD-10-CM | POA: Diagnosis not present

## 2022-09-20 DIAGNOSIS — M7742 Metatarsalgia, left foot: Secondary | ICD-10-CM | POA: Diagnosis not present

## 2022-09-20 DIAGNOSIS — M2042 Other hammer toe(s) (acquired), left foot: Secondary | ICD-10-CM | POA: Diagnosis not present

## 2022-09-20 DIAGNOSIS — S92315K Nondisplaced fracture of first metatarsal bone, left foot, subsequent encounter for fracture with nonunion: Secondary | ICD-10-CM | POA: Diagnosis not present

## 2022-09-22 LAB — CUP PACEART REMOTE DEVICE CHECK
Date Time Interrogation Session: 20240522230520
Implantable Pulse Generator Implant Date: 20230615

## 2022-09-27 ENCOUNTER — Ambulatory Visit (INDEPENDENT_AMBULATORY_CARE_PROVIDER_SITE_OTHER): Payer: Medicare Other

## 2022-09-27 DIAGNOSIS — I4729 Other ventricular tachycardia: Secondary | ICD-10-CM | POA: Diagnosis not present

## 2022-09-28 NOTE — Progress Notes (Signed)
Carelink Summary Report / Loop Recorder 

## 2022-10-06 ENCOUNTER — Other Ambulatory Visit: Payer: Self-pay | Admitting: Student

## 2022-10-20 NOTE — Progress Notes (Signed)
Cardiology Office Note:    Date:  10/26/2022   ID:  Shannon [name], DOB 28-Oct-1945, MRN [id number]  PCP:  Emilio Aspen, MD   [facility] Providers Cardiologist:  Nanetta Batty, MD Electrophysiologist:  Lanier Prude, MD     Referring MD: Emilio Aspen, *   Chief Complaint  Patient presents with   Follow-up  palpitations  History of Present Illness:    Shannon [name] is a 77 y.o. female with a hx of HTN, NSVT, tachypalpitations, AI, and HLD. Heart monitor showed runs of NSVT and SVT. He was referred to EP 10/2020 and started on bisoprolol (changed from lopressor) and flecainide.  Nuclear stress test 09/2020 was low risk. Coronary calcium score 10/2020 was 12 placing her at the 40th percentile. Follow up echo 05/2021 with preserved EF 60-65%, mild MR, mild MVP.  Repeat heart monitor 06/2021 showed 2 runs of SVT that did not correlate with symptoms, no sustained arrhythmias. She continued to have palpitations and ILR implanted.   Recent echo 05/06/22 showed preserved LVEF 60-65%, mild to moderate MR, moderate AI. No arrhythmias found on ILR to date.   Last seen 03/2022 by Dr. Allyson Sabal. She presents for scheduled follow up. She is upset with prior visits in which she was told the palpitations "were in her head."  She continues to have palpitations every morning that improve when she sits up. She also asked three other pharmacists in retail pharmacies who told her she should absolutely never take a statin with a PCSK9i. I attempted to walk this back a bit.    Past Medical History:  Diagnosis Date   Anxiety    Arthritis    Heart murmur    Hypertension    Palpitations     Past Surgical History:  Procedure Laterality Date   ABDOMINAL HYSTERECTOMY     APPENDECTOMY     ARTHRODESIS METATARSALPHALANGEAL JOINT (MTPJ) Left 07/02/2020   Procedure: Left hallux metatarsophalangeal joint arthrodesis;  Surgeon: Toni Arthurs, MD;  Location: [facility];   Service: Orthopedics;  Laterality: Left;   BACK SURGERY  1981   CHOLECYSTECTOMY     EYE SURGERY Bilateral    Cataract   TONSILLECTOMY     TOTAL KNEE ARTHROPLASTY Left 09/26/2019   Procedure: TOTAL KNEE ARTHROPLASTY;  Surgeon: Durene Romans, MD;  Location: [facility];  Service: Orthopedics;  Laterality: Left;  70 mins   WRIST SURGERY Right     Current Medications: Current Meds  Medication Sig   ALPRAZolam (XANAX) 0.25 MG tablet Take 0.125-0.25 mg by mouth at bedtime.    Cholecalciferol (VITAMIN D3) 50 MCG (2000 UT) TABS Take 2,000 mg by mouth daily.   ibuprofen (ADVIL) 400 MG tablet Take 400 mg by mouth every 6 (six) hours as needed.   losartan (COZAAR) 50 MG tablet Take 1 tablet by mouth daily.   rosuvastatin (CRESTOR) 40 MG tablet Take 1 tablet (40 mg total) by mouth daily.   [DISCONTINUED] bisoprolol (ZEBETA) 5 MG tablet Take 1 tablet (5 mg total) by mouth daily. Pt needs to keep upcoming appt in July for further refills   [DISCONTINUED] verapamil (CALAN-SR) 120 MG CR tablet TAKE 1 TABLET(120 MG) BY MOUTH AT BEDTIME     Allergies:   Cephalexin, Morphine, Penicillins, Epinephrine, Hydrochlorothiazide, Hydrocodone, Oxycodone hcl, Pseudoephedrine hcl, Zolpidem tartrate, Norco [hydrocodone-acetaminophen], Oxycodone, and Penicillin v potassium   Social History   Socioeconomic History   Marital status: Married    Spouse name: Not on file  Number of children: Not on file   Years of education: Not on file   Highest education level: Not on file  Occupational History   Not on file  Tobacco Use   Smoking status: Never   Smokeless tobacco: Never  Vaping Use   Vaping Use: Never used  Substance and Sexual Activity   Alcohol use: Yes    Comment: occasional   Drug use: Never   Sexual activity: Not on file  Other Topics Concern   Not on file  Social History Narrative   Not on file   Social Determinants of Health   Financial Resource Strain: Not on file  Food Insecurity: Not on file   Transportation Needs: Not on file  Physical Activity: Not on file  Stress: Not on file  Social Connections: Not on file     Family History: The patient's family history includes Heart attack in her father.  ROS:   Please see the history of present illness.     All other systems reviewed and are negative.  EKGs/Labs/Other Studies Reviewed:    The following studies were reviewed today: Cardiac Studies & Procedures     STRESS TESTS  MYOCARDIAL PERFUSION IMAGING 10/30/2020  Narrative  Nuclear stress EF: 57%.  The left ventricular ejection fraction is normal (55-65%).  There was no ST segment deviation noted during stress.  No T wave inversion was noted during stress.  The study is normal.  This is a low risk study.  1.  Reduced counts in the apical segments with normal wall motion consistent with apical thinning artifact.  No evidence of ischemia or infarction. 2.  Normal LVEF, 57%. 3.  This is a low risk study.   ECHOCARDIOGRAM  ECHOCARDIOGRAM COMPLETE 05/06/2022  Narrative ECHOCARDIOGRAM REPORT    Patient Name:   Shannon [name] Date of Exam: 05/06/2022 Medical Rec #:  [id number]       Height:       64.0 in Accession #:    [id number]      Weight:       136.4 lb Date of Birth:  26-Mar-1946       BSA:          1.663 m Patient Age:    76 years        BP:           128/78 mmHg Patient Gender: F               HR:           58 bpm. Exam Location:  [address]  Procedure: 2D Echo, Cardiac Doppler and Color Doppler  Indications:    I35.1 Aortic valve insufficiency  History:        Patient has prior history of Echocardiogram examinations, most recent 05/05/2021. Signs/Symptoms:Murmur and Palpitations; Risk Factors:Hypertension.  Sonographer:    Sedonia Small Rodgers-Jones RDCS Referring Phys: [id number] JONATHAN J BERRY  IMPRESSIONS   1. Left ventricular ejection fraction, by estimation, is 60 to 65%. The left ventricle has normal function. The left ventricle has no  regional wall motion abnormalities. Left ventricular diastolic parameters were normal. 2. Right ventricular systolic function is normal. The right ventricular size is normal. The estimated right ventricular systolic pressure is 29.4 mmHg. 3. The mitral valve is normal in structure. Mild to moderate mitral valve regurgitation. No evidence of mitral stenosis. 4. The aortic valve is tricuspid. Aortic valve regurgitation is moderate. No aortic stenosis is present. 5. The inferior vena cava is normal in  size with greater than 50% respiratory variability, suggesting right atrial pressure of 3 mmHg.  Comparison(s): No significant change from prior study. Prior images reviewed side by side. Aortic insufficiency is unchanged. The previous report of mitral valve prolapse is not confirmed on the current images. The mitral regurgitant jet is centrally directed.  FINDINGS Left Ventricle: Left ventricular ejection fraction, by estimation, is 60 to 65%. The left ventricle has normal function. The left ventricle has no regional wall motion abnormalities. The left ventricular internal cavity size was normal in size. There is no left ventricular hypertrophy. Left ventricular diastolic parameters were normal. Normal left ventricular filling pressure.  Right Ventricle: The right ventricular size is normal. No increase in right ventricular wall thickness. Right ventricular systolic function is normal. The tricuspid regurgitant velocity is 2.57 m/s, and with an assumed right atrial pressure of 3 mmHg, the estimated right ventricular systolic pressure is 29.4 mmHg.  Left Atrium: Left atrial size was normal in size.  Right Atrium: Right atrial size was normal in size.  Pericardium: There is no evidence of pericardial effusion.  Mitral Valve: The mitral valve is normal in structure. Mild to moderate mitral valve regurgitation, with centrally-directed jet. No evidence of mitral valve stenosis.  Tricuspid Valve: The  tricuspid valve is normal in structure. Tricuspid valve regurgitation is mild . No evidence of tricuspid stenosis.  Aortic Valve: The aortic valve is tricuspid. Aortic valve regurgitation is moderate. Aortic regurgitation PHT measures 462 msec. No aortic stenosis is present. Aortic valve mean gradient measures 8.7 mmHg. Aortic valve peak gradient measures 15.1 mmHg. Aortic valve area, by VTI measures 1.18 cm.  Pulmonic Valve: The pulmonic valve was normal in structure. Pulmonic valve regurgitation is trivial. No evidence of pulmonic stenosis.  Aorta: The aortic root is normal in size and structure.  Venous: The inferior vena cava is normal in size with greater than 50% respiratory variability, suggesting right atrial pressure of 3 mmHg.  IAS/Shunts: No atrial level shunt detected by color flow Doppler.   LEFT VENTRICLE PLAX 2D LVIDd:         5.20 cm   Diastology LVIDs:         3.10 cm   LV e' medial:    9.52 cm/s LV PW:         0.50 cm   LV E/e' medial:  9.0 LV IVS:        0.50 cm   LV e' lateral:   11.95 cm/s LVOT diam:     1.60 cm   LV E/e' lateral: 7.2 LV SV:         59 LV SV Index:   35 LVOT Area:     2.01 cm   RIGHT VENTRICLE             IVC RV Basal diam:  3.20 cm     IVC diam: 1.30 cm RV S prime:     12.20 cm/s TAPSE (M-mode): 2.6 cm  LEFT ATRIUM             Index        RIGHT ATRIUM           Index LA diam:        3.60 cm 2.17 cm/m   RA Area:     11.40 cm LA Vol (A2C):   43.8 ml 26.34 ml/m  RA Volume:   25.80 ml  15.52 ml/m LA Vol (A4C):   37.3 ml 22.43 ml/m LA Biplane Vol: 41.7 ml 25.08  ml/m AORTIC VALVE AV Area (Vmax):    1.25 cm AV Area (Vmean):   1.17 cm AV Area (VTI):     1.18 cm AV Vmax:           194.00 cm/s AV Vmean:          136.333 cm/s AV VTI:            0.496 m AV Peak Grad:      15.1 mmHg AV Mean Grad:      8.7 mmHg LVOT Vmax:         120.50 cm/s LVOT Vmean:        79.350 cm/s LVOT VTI:          0.292 m LVOT/AV VTI ratio: 0.59 AI PHT:             462 msec  AORTA Ao Root diam: 2.60 cm Ao Asc diam:  3.20 cm  MITRAL VALVE               TRICUSPID VALVE MV Area (PHT): 3.99 cm    TR Peak grad:   26.4 mmHg MV Decel Time: 190 msec    TR Vmax:        257.00 cm/s MV E velocity: 85.95 cm/s MV A velocity: 67.60 cm/s  SHUNTS MV E/A ratio:  1.27        Systemic VTI:  0.29 m Systemic Diam: 1.60 cm  Mihai Croitoru MD Electronically signed by Thurmon Fair MD Signature Date/Time: 05/06/2022/11:17:22 AM    Final    MONITORS  LONG TERM MONITOR (3-14 DAYS) 06/23/2021  Narrative HR 45 - 128 bpm, average 57 bpm. 2 SVT, longest lasting 13 beats. Review of rhythm strip suggests atrial tachycardia. Rare supraventricular and ventricular ectopy. No sustained arrhythmias.  Sheria Lang T. Lalla Brothers, MD, [facility] Cardiac Electrophysiology   CT SCANS  CT CARDIAC SCORING (SELF PAY ONLY) 11/24/2020  Addendum 11/24/2020  9:34 PM ADDENDUM REPORT: 11/24/2020 21:32  CLINICAL DATA:  Cardiovascular Disease Risk stratification  EXAM: Coronary Calcium Score  TECHNIQUE: A gated, non-contrast computed tomography scan of the heart was performed using 3mm slice thickness. Axial images were analyzed on a dedicated workstation. Calcium scoring of the coronary arteries was performed using the Agatston method.  FINDINGS: Coronary arteries: Normal origins.  Coronary Calcium Score:  Left main: 0  Left anterior descending artery: 0  Left circumflex artery: 0  Right coronary artery: 12  Total: 12  Percentile: 40  Pericardium: Normal.  Ascending Aorta: Normal caliber.  Non-cardiac: See separate report from [facility].  IMPRESSION: Coronary calcium score of 12. This was 40th percentile for age-, race-, and sex-matched controls.  RECOMMENDATIONS: Coronary artery calcium (CAC) score is a strong predictor of incident coronary heart disease (CHD) and provides predictive information beyond traditional risk factors. CAC  scoring is reasonable to use in the decision to withhold, postpone, or initiate statin therapy in intermediate-risk or selected borderline-risk asymptomatic adults (age 27-75 years and LDL-C >=70 to <190 mg/dL) who do not have diabetes or established atherosclerotic cardiovascular disease (ASCVD).* In intermediate-risk (10-year ASCVD risk >=7.5% to <20%) adults or selected borderline-risk (10-year ASCVD risk >=5% to <7.5%) adults in whom a CAC score is measured for the purpose of making a treatment decision the following recommendations have been made:  If CAC=0, it is reasonable to withhold statin therapy and reassess in 5 to 10 years, as long as higher risk conditions are absent (diabetes mellitus, family history of premature CHD in first degree relatives (males <  55 years; females <65 years), cigarette smoking, or LDL >=190 mg/dL).  If CAC is 1 to 99, it is reasonable to initiate statin therapy for patients >=43 years of age.  If CAC is >=100 or >=75th percentile, it is reasonable to initiate statin therapy at any age.  Cardiology referral should be considered for patients with CAC scores >=400 or >=75th percentile.  *2018 AHA/ACC/AACVPR/AAPA/ABC/ACPM/ADA/AGS/APhA/ASPC/NLA/PCNA Guideline on the Management of Blood Cholesterol: A Report of the American College of Cardiology/American Heart Association Task Force on Clinical Practice Guidelines. J Am Coll Cardiol. 2019;73(24):3168-3209.  Epifanio Lesches, MD   Electronically Signed By: Epifanio Lesches MD On: 11/24/2020 21:32  Narrative EXAM: OVER-READ INTERPRETATION  CT CHEST  The following report is an over-read performed by radiologist Dr. Jeronimo Greaves of [facility] on 11/24/2020. This over-read does not include interpretation of cardiac or coronary anatomy or pathology. The calcium score interpretation by the cardiologist is attached.  COMPARISON:  10/13/2003 chest  radiograph.  FINDINGS: Vascular: Aortic atherosclerosis.  Mediastinum/Nodes: No imaged thoracic adenopathy.  Lungs/Pleura: No pleural fluid. Mild peripheral and basilar predominant interstitial opacities.  Upper Abdomen: Normal imaged portions of the liver, stomach.  Musculoskeletal: No acute osseous abnormality. Midthoracic spondylosis.  IMPRESSION: 1. No acute findings in the imaged extracardiac chest. 2. Aortic Atherosclerosis (ICD10-I70.0). 3. Peripheral predominant mild interstitial opacities, possibly post infectious/inflammatory scarring. If there are chronic pulmonary symptoms, interstitial lung disease could look similar. If any given history, consider pulmonology consultation.  Electronically Signed: By: Jeronimo Greaves M.D. On: 11/24/2020 08:53          EKG Interpretation  Date/Time:  Wednesday October 26 2022 10:59:58 EDT Ventricular Rate:  54 PR Interval:  156 QRS Duration: 76 QT Interval:  436 QTC Calculation: 413 R Axis:   24 Text Interpretation: Sinus bradycardia Low voltage QRS When compared with ECG of 17-Sep-2019 13:55, No significant change was found Confirmed by Micah Flesher ([id number]) on 10/26/2022 11:00:41 AM    Recent Labs: 03/02/2022: ALT 32  Recent Lipid Panel    Component Value Date/Time   CHOL 146 06/09/2022 0848   TRIG 90 06/09/2022 0848   HDL 67 06/09/2022 0848   CHOLHDL 2.2 06/09/2022 0848   LDLCALC 62 06/09/2022 0848     Risk Assessment/Calculations:                Physical Exam:    VS:  BP 124/68   Pulse (!) 54   Ht 5\' 4"  (1.626 m)   Wt 130 lb 12.8 oz (59.3 kg)   BMI 22.45 kg/m     Wt Readings from Last 3 Encounters:  10/26/22 130 lb 12.8 oz (59.3 kg)  03/02/22 136 lb 6.4 oz (61.9 kg)  10/14/21 136 lb (61.7 kg)     GEN:  Well nourished, well developed in no acute distress HEENT: Normal NECK: No JVD; No carotid bruits LYMPHATICS: No lymphadenopathy CARDIAC: RRR, no murmurs, rubs, gallops RESPIRATORY:  Clear to  auscultation without rales, wheezing or rhonchi  ABDOMEN: Soft, non-tender, non-distended MUSCULOSKELETAL:  No edema; No deformity  SKIN: Warm and dry NEUROLOGIC:  Alert and oriented x 3 PSYCHIATRIC:  Normal affect   ASSESSMENT:    1. NSVT (nonsustained ventricular tachycardia) (HCC)   2. Nonrheumatic aortic valve insufficiency   3. Nonrheumatic mitral valve regurgitation   4. Hyperlipidemia, unspecified hyperlipidemia type    PLAN:    In order of problems listed above:  NSVT  Hx of NSVT and SVT ILR in place - she wants to  stop monitoring Followed by EP On bisoprolol, flecainide Palpitations most mornings relieved with sitting up   HTN Bisoprolol, verapamil, losartan, hydrochlorothiazide BP well controlled today BMP 07/2022 with PCP reviewed, stable   HLD with LDL goal < 70 Crestor 40 mg 06/09/2022: Cholesterol, Total 146; HDL 67; LDL Chol Calc (NIH) 62; Triglycerides 90 She did not tolerate zetia due to hair loss - has been off of this over 1 month, she continues taking the crestor - will check fasting labs today - of LDL elevated, can try fenofibrate - she does not want to do an injectable, she will not be able to do it herself - she noted that Dr. Allyson Sabal suggested a goal LDL less than 55   Moderate AI, mild MR On recent echo Repeat echo in 05/2023   Echo in Jan 2025 Follow up with Dr. Allyson Sabal in 1 year.    Medication Adjustments/Labs and Tests Ordered: Current medicines are reviewed at length with the patient today.  Concerns regarding medicines are outlined above.  Orders Placed This Encounter  Procedures   Lipid panel   EKG 12-Lead   ECHOCARDIOGRAM COMPLETE   Meds ordered this encounter  Medications   DISCONTD: bisoprolol (ZEBETA) 5 MG tablet    Sig: Take 1 tablet (5 mg total) by mouth daily. Pt needs to keep upcoming appt in July for further refills    Dispense:  90 tablet    Refill:  0   verapamil (CALAN-SR) 120 MG CR tablet    Sig: TAKE 1 TABLET(120  MG) BY MOUTH AT BEDTIME    Dispense:  90 tablet    Refill:  3   bisoprolol (ZEBETA) 5 MG tablet    Sig: Take 1 tablet (5 mg total) by mouth daily. Pt needs to keep upcoming appt in July for further refills    Dispense:  90 tablet    Refill:  3    Patient Instructions  Medication Instructions:   Your physician recommends that you continue on your current medications as directed. Please refer to the Current Medication list given to you today.  *If you need a refill on your cardiac medications before your next appointment, please call your pharmacy*  Lab Work: Your physician recommends that you have lab work TODAY:  FLP  If you have labs (blood work) drawn today and your tests are completely normal, you will receive your results only by: MyChart Message (if you have MyChart) OR A paper copy in the mail If you have any lab test that is abnormal or we need to change your treatment, we will call you to review the results.  Testing/Procedures: Your physician has requested that you have an echocardiogram. Echocardiography is a painless test that uses sound waves to create images of your heart. It provides your doctor with information about the size and shape of your heart and how well your heart's chambers and valves are working. This procedure takes approximately one hour. There are no restrictions for this procedure. Please do NOT wear cologne, perfume, aftershave, or lotions (deodorant is allowed). Please arrive 15 minutes prior to your appointment time.  Please schedule for January 2025   Follow-Up: At [facility], you and your health needs are our priority.  As part of our continuing mission to provide you with exceptional heart care, we have created designated Provider Care Teams.  These Care Teams include your primary Cardiologist (physician) and Advanced Practice Providers (APPs -  Physician Assistants and Nurse Practitioners) who all work together  to provide you with the  care you need, when you need it.  Your next appointment:   1 year(s)  Provider:   Nanetta Batty, MD     Other Instructions     Signed, Marcelino Duster, PA  10/26/2022 11:49 AM    [facility]

## 2022-10-21 NOTE — Progress Notes (Signed)
Carelink Summary Report / Loop Recorder 

## 2022-10-25 LAB — CUP PACEART REMOTE DEVICE CHECK
Date Time Interrogation Session: 20240624230552
Implantable Pulse Generator Implant Date: 20230615

## 2022-10-26 ENCOUNTER — Ambulatory Visit: Payer: Medicare Other | Attending: Physician Assistant | Admitting: Physician Assistant

## 2022-10-26 ENCOUNTER — Encounter: Payer: Self-pay | Admitting: Physician Assistant

## 2022-10-26 VITALS — BP 124/68 | HR 54 | Ht 64.0 in | Wt 130.8 lb

## 2022-10-26 DIAGNOSIS — I340 Nonrheumatic mitral (valve) insufficiency: Secondary | ICD-10-CM | POA: Diagnosis not present

## 2022-10-26 DIAGNOSIS — E785 Hyperlipidemia, unspecified: Secondary | ICD-10-CM

## 2022-10-26 DIAGNOSIS — I10 Essential (primary) hypertension: Secondary | ICD-10-CM

## 2022-10-26 DIAGNOSIS — I351 Nonrheumatic aortic (valve) insufficiency: Secondary | ICD-10-CM | POA: Diagnosis not present

## 2022-10-26 DIAGNOSIS — I4729 Other ventricular tachycardia: Secondary | ICD-10-CM | POA: Diagnosis not present

## 2022-10-26 MED ORDER — BISOPROLOL FUMARATE 5 MG PO TABS: 5.0000 mg | ORAL_TABLET | Freq: Every day | ORAL | 0 refills | Status: DC

## 2022-10-26 MED ORDER — BISOPROLOL FUMARATE 5 MG PO TABS: 5.0000 mg | ORAL_TABLET | Freq: Every day | ORAL | 3 refills | Status: DC

## 2022-10-26 MED ORDER — VERAPAMIL HCL ER 120 MG PO TBCR: EXTENDED_RELEASE_TABLET | ORAL | 3 refills | Status: DC

## 2022-10-26 NOTE — Patient Instructions (Signed)
Medication Instructions:   Your physician recommends that you continue on your current medications as directed. Please refer to the Current Medication list given to you today.  *If you need a refill on your cardiac medications before your next appointment, please call your pharmacy*  Lab Work: Your physician recommends that you have lab work TODAY:  FLP  If you have labs (blood work) drawn today and your tests are completely normal, you will receive your results only by: MyChart Message (if you have MyChart) OR A paper copy in the mail If you have any lab test that is abnormal or we need to change your treatment, we will call you to review the results.  Testing/Procedures: Your physician has requested that you have an echocardiogram. Echocardiography is a painless test that uses sound waves to create images of your heart. It provides your doctor with information about the size and shape of your heart and how well your heart's chambers and valves are working. This procedure takes approximately one hour. There are no restrictions for this procedure. Please do NOT wear cologne, perfume, aftershave, or lotions (deodorant is allowed). Please arrive 15 minutes prior to your appointment time.  Please schedule for January 2025   Follow-Up: At [facility], you and your health needs are our priority.  As part of our continuing mission to provide you with exceptional heart care, we have created designated Provider Care Teams.  These Care Teams include your primary Cardiologist (physician) and Advanced Practice Providers (APPs -  Physician Assistants and Nurse Practitioners) who all work together to provide you with the care you need, when you need it.  Your next appointment:   1 year(s)  Provider:   Nanetta Batty, MD     Other Instructions

## 2022-10-27 ENCOUNTER — Telehealth: Payer: Self-pay | Admitting: Physician Assistant

## 2022-10-27 DIAGNOSIS — E782 Mixed hyperlipidemia: Secondary | ICD-10-CM

## 2022-10-27 LAB — LIPID PANEL
Chol/HDL Ratio: 2.3 ratio (ref 0.0–4.4)
Cholesterol, Total: 165 mg/dL (ref 100–199)
HDL: 71 mg/dL (ref >39)
LDL Chol Calc (NIH): 75 mg/dL (ref 0–99)
Triglycerides: 107 mg/dL (ref 0–149)
VLDL Cholesterol Cal: 19 mg/dL (ref 5–40)

## 2022-10-27 MED ORDER — FENOFIBRATE 48 MG PO TABS: 48.0000 mg | ORAL_TABLET | Freq: Every day | ORAL | 3 refills | Status: DC

## 2022-10-27 NOTE — Telephone Encounter (Signed)
Patient states she wants to take the Fenofibrate.  She states discussing at the appointment but wasn't sure about it.  She ask to please send to [facility] in file Please advise dosage

## 2022-10-27 NOTE — Telephone Encounter (Signed)
Pt would like a callback regarding medication, Fenofibrate. Pt states that medication was discussed at visit yesterday and would like to consider taking. Please advise

## 2022-10-27 NOTE — Telephone Encounter (Signed)
Patient aware it will be sent to pharmacy.  Nothing further needed at this time

## 2022-10-27 NOTE — Telephone Encounter (Signed)
I placed the order for low dose fenofibrate. If she does well, we can increase to 145 mg.

## 2022-10-28 ENCOUNTER — Telehealth: Payer: Self-pay | Admitting: Cardiovascular Disease

## 2022-10-28 NOTE — Telephone Encounter (Signed)
I placed the order for low dose fenofibrate. If she does well, we can increase to 145 mg.       Patient ask with the new lab results and "no medication changes"  Should she start the fenofibrate or not?? Please advise

## 2022-10-28 NOTE — Telephone Encounter (Signed)
Pt c/o medication issue:  1. Name of Medication: Fenofibrate  2. How are you currently taking this medication (dosage and times per day)?   3. Are you having a reaction (difficulty breathing--STAT)?   4. What is your medication issue? Patient wants to know if she needs to take Fenofibrate

## 2022-10-31 ENCOUNTER — Ambulatory Visit (INDEPENDENT_AMBULATORY_CARE_PROVIDER_SITE_OTHER): Payer: Medicare Other

## 2022-10-31 DIAGNOSIS — R002 Palpitations: Secondary | ICD-10-CM

## 2022-11-02 DIAGNOSIS — M2022 Hallux rigidus, left foot: Secondary | ICD-10-CM | POA: Diagnosis not present

## 2022-11-21 NOTE — Progress Notes (Signed)
Carelink Summary Report / Loop Recorder 

## 2022-11-27 NOTE — Progress Notes (Unsigned)
Cardiology Office Note Date:  11/27/2022  Patient ID:  Shannon [name] 1946/03/19, MRN [id number] PCP:  Emilio Aspen, MD  Cardiologist:  Dr. Allyson Sabal Electrophysiologist: Dr. Johney Frame >> Dr. Lalla Brothers    Chief Complaint:  *** annual visit   History of Present Illness: Shannon [name] is a 77 y.o. female with history of ATach, anxiety, HTN  NOTE Hx of flecainide stopped with ongoing palpitations Metoprolol gave her headaches   She saw A. 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 She was also advised to discuss her anxiety and insomnia with her PMD noting some nights taking Xanax has helped her.  She saw Dr. Allyson Sabal 02/24/21, at that visit reported some improvement in in her nighttime palpitations.  I saw her 03/05/21 She agrees that she is doing better. Palpitations are less frequent and less symptomatic, shorter on the bisoprolol No CP No exertional intolerances.  She is busy and active all day, constantly on the move, is never aware of any palpitations with exertion or in the day time She feels like now, either just as she is due to get up she feels a fast flutter, these either wake her or happen just before she wakes perhaps. No near syncope or syncope. She is due for a colonoscopy, asks if she would be ok to do a colonoscopy We reviewed her monitor tracings together today All of the patient triggered events are SR, no ectopy, normal HR, some artifact The couple PATs and one NSVT were auto triggered No changes were made, planned to see her in 52mo, sooner if needed.   I saw her  06/07/21 Generally she is doing great except she has significant palpitations. Previously she was mostly bothered by palpitations at HS, evenings keeping her from sleep, these are better with the bisoprolol, though now being woken up nearly every morning in the last months. They are fast and persistent until she gets up gets a glass of water, and by the time back to bed or sits are resolved, but are lasting minutes now. No associated symptoms, she is not dizzy, no CP, SOB, but they are very anxiety provoking  She does not feel them through the day Outside of that, she remains very active, walks 2-31miles most days weather permitting with good exertional capacity No SOB, DOE She will randomly feel a heavy sense in her chest, this is infrequent random, short lived, no exertional or positional Planned to re-monitor with a change in her symptom behavior  She saw Dr. Lalla Brothers 09/09/21, zio monitor shows sinus rhythm during symptom triggered episodes when she reports heart flutters, etc.  Discussed perhaps ILR  10/14/21: ILR placed  Saw Dr. Allyson Sabal Nov 2023, no abnormal rhythms on loop so far, only reported c/l L knee pain  She saw A. [facility] 10/26/22, ongoing daily palpitations, upset about being told they were "in her head", also reported she had been told by a number of sources that she should never take a statin with a PCSK9i.   They reviewed this, planned for repeat lipid labs and further management decisions pending that. The patient wanted  to stop ILR monitoring Deferred to EP  *** GI? *** apnea?? *** stop monitoring?  Device information MDT LINQII implanted 10/14/21 for palpitations   Past Medical History:  Diagnosis Date   Anxiety    Arthritis    Heart murmur    Hypertension    Palpitations     Past Surgical History:  Procedure Laterality Date   ABDOMINAL HYSTERECTOMY     APPENDECTOMY     ARTHRODESIS METATARSALPHALANGEAL JOINT (MTPJ) Left 07/02/2020   Procedure: Left hallux  metatarsophalangeal joint arthrodesis;  Surgeon: Toni Arthurs, MD;  Location: [facility];  Service: Orthopedics;  Laterality: Left;   BACK SURGERY  1981   CHOLECYSTECTOMY     EYE SURGERY Bilateral    Cataract   TONSILLECTOMY     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      bisoprolol (ZEBETA) 5 MG tablet Take 1 tablet (5 mg total) by mouth daily. Pt needs to keep upcoming appt in July for further refills 90 tablet 3   Cholecalciferol (VITAMIN D3) 50 MCG (2000 UT) TABS Take 2,000 mg by mouth daily.     fenofibrate (TRICOR) 48 MG tablet Take 1 tablet (48 mg total) by mouth daily. 90 tablet 3   ibuprofen (ADVIL) 400 MG tablet Take 400 mg by mouth every 6 (six) hours as needed.     losartan (COZAAR) 50 MG tablet Take 1 tablet by mouth daily.     rosuvastatin (CRESTOR) 40 MG tablet Take 1 tablet (40 mg total) by mouth daily. 90 tablet 4   verapamil (CALAN-SR) 120 MG CR tablet TAKE 1 TABLET(120 MG) BY MOUTH AT BEDTIME 90 tablet 3   No current facility-administered medications for this visit.   Facility-Administered Medications Ordered in Other Visits  Medication Dose Route Frequency Provider Last Rate Last Admin   technetium tetrofosmin (TC-MYOVIEW) injection 30.7 millicurie  30.7 millicurie Intravenous Once PRN O'Neal, Ronnald Ramp, MD        Allergies:   Cephalexin, Morphine, Penicillins, Epinephrine, Hydrochlorothiazide, Hydrocodone, Oxycodone hcl, Pseudoephedrine hcl, Zolpidem tartrate, Norco [hydrocodone-acetaminophen], Oxycodone, and Penicillin v potassium   Social History:  The patient  reports that she has never smoked. She has never used smokeless tobacco. She reports  current alcohol use. She reports that she does not use drugs.   Family History:  The patient's family history includes Heart attack in her father.  ROS:  Please see the history of present illness.    All other systems are reviewed and otherwise negative.   PHYSICAL EXAM:  VS:  There were no vitals taken for this visit. BMI: There is no height or weight on file to calculate BMI. Well nourished, well developed, in no acute distress HEENT: normocephalic, atraumatic Neck: no JVD, carotid bruits or masses Cardiac: *** RRR; no significant murmurs, no rubs, or gallops Lungs: *** CTA b/l, no wheezing, rhonchi or rales Abd: soft, nontender MS: no deformity or atrophy Ext: *** no edema Skin: warm and dry, no rash Neuro:  No gross deficits appreciated Psych: euthymic mood, full affect   EKG:  not done today   05/06/22: TTE 1. Left ventricular ejection fraction, by estimation, is 60 to 65%. The  left  ventricle has normal function. The left ventricle has no regional  wall motion abnormalities. Left ventricular diastolic parameters were  normal.   2. Right ventricular systolic function is normal. The right ventricular  size is normal. The estimated right ventricular systolic pressure is 29.4  mmHg.   3. The mitral valve is normal in structure. Mild to moderate mitral valve  regurgitation. No evidence of mitral stenosis.   4. The aortic valve is tricuspid. Aortic valve regurgitation is moderate.  No aortic stenosis is present.   5. The inferior vena cava is normal in size with greater than 50%  respiratory variability, suggesting right atrial pressure of 3 mmHg.     05/05/21: TTE IMPRESSIONS   1. Left ventricular ejection fraction, by estimation, is 60 to 65%. The  left ventricle has normal function. The left ventricle has no regional  wall motion abnormalities. Left ventricular diastolic parameters were  normal.   2. Right ventricular systolic function is normal. The right ventricular   size is normal. There is normal pulmonary artery systolic pressure. The  estimated right ventricular systolic pressure is 27.8 mmHg.   3. The mitral valve is normal in structure. Mild mitral valve  regurgitation. No evidence of mitral stenosis. 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   4. The aortic valve is tricuspid. There is mild calcification of the  aortic valve. Aortic valve regurgitation is moderate. No aortic stenosis  is present.   5. The inferior vena cava is normal in size with greater than 50%  respiratory variability, suggesting right atrial pressure of 3 mmHg.    10/28/20: stress myoview Nuclear stress EF: 57%. The left ventricular ejection fraction is normal (55-65%). There was no ST segment deviation noted during stress. No T wave inversion was noted during stress. The study is normal. This is a low risk study.   1.  Reduced counts in the apical segments with normal wall motion consistent with apical thinning artifact.  No evidence of ischemia or infarction. 2.  Normal LVEF, 57%. 3.  This is a low risk study.   04/29/2020: TTE LVEF 59.85% Grade I DD Mod AI  Recent Labs: 03/02/2022: ALT 32  10/26/2022: Chol/HDL Ratio 2.3; Cholesterol, Total 165; HDL 71; LDL Chol Calc (NIH) 75; Triglycerides 107   CrCl cannot be calculated (Patient's most recent lab result is older than the maximum 21 days allowed.).   Wt Readings from Last 3 Encounters:  10/26/22 130 lb 12.8 oz (59.3 kg)  03/02/22 136 lb 6.4 oz (61.9 kg)  10/14/21 136 lb (61.7 kg)     Other studies reviewed: Additional studies/records reviewed today include: summarized above  ASSESSMENT AND PLAN:  Atrial tachycardia ILR ***   3. HTN *** Looks ok  4. VHD Mod AI on her last echo Mild-mod MR     Disposition: ***  Current medicines are reviewed at length with the patient today.  The patient did not have any concerns regarding  medicines.  Norma Fredrickson, PA-C 11/27/2022 9:23 AM     [facility] [address] [place] [address] [phone] (office)  [phone] (fax)

## 2022-11-29 ENCOUNTER — Ambulatory Visit: Payer: Medicare Other | Attending: Physician Assistant | Admitting: Physician Assistant

## 2022-11-29 ENCOUNTER — Encounter: Payer: Self-pay | Admitting: Physician Assistant

## 2022-11-29 VITALS — BP 130/80 | HR 54 | Ht 64.0 in | Wt 131.4 lb

## 2022-11-29 DIAGNOSIS — R002 Palpitations: Secondary | ICD-10-CM

## 2022-11-29 DIAGNOSIS — I4719 Other supraventricular tachycardia: Secondary | ICD-10-CM

## 2022-11-29 DIAGNOSIS — Z4509 Encounter for adjustment and management of other cardiac device: Secondary | ICD-10-CM | POA: Diagnosis not present

## 2022-11-29 DIAGNOSIS — I10 Essential (primary) hypertension: Secondary | ICD-10-CM | POA: Diagnosis not present

## 2022-11-29 LAB — CUP PACEART INCLINIC DEVICE CHECK
Date Time Interrogation Session: 20240730132614
Implantable Pulse Generator Implant Date: 20230615

## 2022-11-29 NOTE — Patient Instructions (Signed)
Medication Instructions:   Your physician recommends that you continue on your current medications as directed. Please refer to the Current Medication list given to you today.  *If you need a refill on your cardiac medications before your next appointment, please call your pharmacy*   Lab Work: NONE ORDERED  TODAY   If you have labs (blood work) drawn today and your tests are completely normal, you will receive your results only by: MyChart Message (if you have MyChart) OR A paper copy in the mail If you have any lab test that is abnormal or we need to change your treatment, we will call you to review the results.   Testing/Procedures: NONE ORDERED  TODAY     Follow-Up: At [facility], you and your health needs are our priority.  As part of our continuing mission to provide you with exceptional heart care, we have created designated Provider Care Teams.  These Care Teams include your primary Cardiologist (physician) and Advanced Practice Providers (APPs -  Physician Assistants and Nurse Practitioners) who all work together to provide you with the care you need, when you need it.  We recommend signing up for the patient portal called "MyChart".  Sign up information is provided on this After Visit Summary.  MyChart is used to connect with patients for Virtual Visits (Telemedicine).  Patients are able to view lab/test results, encounter notes, upcoming appointments, etc.  Non-urgent messages can be sent to your provider as well.   To learn more about what you can do with MyChart, go to [email or web address].    Your next appointment:   4 month(s)  Provider:   You will see one of the following Advanced Practice Providers on your designated Care Team:   Francis Dowse, [place]    Other Instructions

## 2022-11-30 DIAGNOSIS — M2022 Hallux rigidus, left foot: Secondary | ICD-10-CM | POA: Diagnosis not present

## 2022-12-05 ENCOUNTER — Ambulatory Visit: Payer: Medicare Other

## 2022-12-21 ENCOUNTER — Telehealth: Payer: Self-pay | Admitting: *Deleted

## 2022-12-21 NOTE — Telephone Encounter (Signed)
   Pre-operative Risk Assessment    Patient Name: Shannon [name]  DOB: 01/25/1946 MRN: [id number]    DATE OF LAST VISIT: 11/29/22 Francis Dowse, PAC DATE OF NEXT VISIT: 03/27/23 Francis Dowse, [facility]  Request for Surgical Clearance    Procedure:   LEFT 2ND AND 3RD HAMMERTOE CORRECTIONS  Date of Surgery:  Clearance 12/27/22                                 Surgeon:  DR. Jonny Ruiz HEWITT Surgeon's Group or Practice Name:  [facility] Phone number:  [phone] Fax number:  [phone] ATTN: MEGAN DAVIS   Type of Clearance Requested:   - Medical ; NO MEDICATIONS LISTED AS NEEDING TO BE HELD   Type of Anesthesia:  General  & LOCAL   Additional requests/questions:    Elpidio Anis   12/21/2022, 10:20 AM

## 2022-12-21 NOTE — Telephone Encounter (Signed)
Good Morning Shannon [name],  We have received a surgical clearance request for Shannon [name] for a hammertoe correction procedure. They were seen recently in clinic on 11/29/2022. Can you please comment on surgical clearance. Please forward you guidance and recommendations to P [facility] PREOP.  Thank you, Alden Server

## 2022-12-22 NOTE — Telephone Encounter (Signed)
   Patient Name: Shannon [name]  DOB: 18-Feb-1946 MRN: [id number]  Primary Cardiologist: Nanetta Batty, MD  Chart reviewed as part of pre-operative protocol coverage. Given past medical history and time since last visit, based on ACC/AHA guidelines, Shannon [name] is at acceptable risk for the planned procedure without further cardiovascular testing.   The patient was advised that if she develops new symptoms prior to surgery to contact our office to arrange for a follow-up visit, and she verbalized understanding.  I will route this recommendation to the requesting party via Epic fax function and remove from pre-op pool.  Please call with questions.  Napoleon Form, Leodis Rains, NP 12/22/2022, 8:23 AM

## 2022-12-27 DIAGNOSIS — M2042 Other hammer toe(s) (acquired), left foot: Secondary | ICD-10-CM | POA: Diagnosis not present

## 2023-01-09 ENCOUNTER — Ambulatory Visit: Payer: Medicare Other

## 2023-01-13 DIAGNOSIS — L570 Actinic keratosis: Secondary | ICD-10-CM | POA: Diagnosis not present

## 2023-01-13 DIAGNOSIS — X32XXXD Exposure to sunlight, subsequent encounter: Secondary | ICD-10-CM | POA: Diagnosis not present

## 2023-01-13 DIAGNOSIS — L920 Granuloma annulare: Secondary | ICD-10-CM | POA: Diagnosis not present

## 2023-02-10 DIAGNOSIS — M2042 Other hammer toe(s) (acquired), left foot: Secondary | ICD-10-CM | POA: Diagnosis not present

## 2023-02-13 ENCOUNTER — Ambulatory Visit: Payer: Medicare Other

## 2023-03-08 ENCOUNTER — Other Ambulatory Visit: Payer: Self-pay | Admitting: Cardiovascular Disease

## 2023-03-08 DIAGNOSIS — E782 Mixed hyperlipidemia: Secondary | ICD-10-CM

## 2023-03-08 DIAGNOSIS — Z8249 Family history of ischemic heart disease and other diseases of the circulatory system: Secondary | ICD-10-CM

## 2023-03-13 ENCOUNTER — Ambulatory Visit (INDEPENDENT_AMBULATORY_CARE_PROVIDER_SITE_OTHER): Payer: Medicare Other

## 2023-03-13 DIAGNOSIS — R002 Palpitations: Secondary | ICD-10-CM

## 2023-03-14 LAB — CUP PACEART REMOTE DEVICE CHECK
Date Time Interrogation Session: 20241111231228
Implantable Pulse Generator Implant Date: 20230615

## 2023-03-20 ENCOUNTER — Ambulatory Visit: Payer: Medicare Other

## 2023-03-21 NOTE — Progress Notes (Signed)
Cardiology Office Note Date:  03/21/2023  Patient ID:  Shannon [name], Shannon [name] Sep 26, 1945, MRN [id number] PCP:  Emilio Aspen, MD  Cardiologist:  Dr. Allyson Sabal Electrophysiologist: Dr. Johney Frame >> Dr. Lalla Brothers    Chief Complaint:    4 mo   History of Present Illness: Shannon [name] is a 77 y.o. female with history of ATach, anxiety, HTN  NOTE Hx of flecainide stopped with ongoing palpitations Metoprolol gave her headaches   She saw A. 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 She was also advised to discuss her anxiety and insomnia with her PMD noting some nights taking Xanax has helped her.  She saw Dr. Allyson Sabal 02/24/21, at that visit reported some improvement in in her nighttime palpitations.  I saw her 03/05/21 She agrees that she is doing better. Palpitations are less frequent and less symptomatic, shorter on the bisoprolol No CP No exertional intolerances.  She is busy and active all day, constantly on the move, is never aware of any palpitations with exertion or in the day time She feels like now, either just as she is due to get up she feels a fast flutter, these either wake her or happen just before she wakes perhaps. No near syncope or syncope. She is due for a colonoscopy, asks if she would be ok to do a colonoscopy We reviewed her monitor tracings together today All of the patient triggered events are SR, no ectopy, normal HR, some artifact The couple PATs and one NSVT were auto triggered No changes were made, planned to see her in 69mo, sooner if needed.   I saw her  06/07/21 Generally she is doing great except she has significant palpitations. Previously she was mostly bothered by palpitations at HS, evenings keeping her from sleep, these are better with the bisoprolol, though now being woken up nearly every morning in the last months. They are fast and persistent until she gets up gets a glass of water, and by the time back to bed or sits are resolved, but are lasting minutes now. No associated symptoms, she is not dizzy, no CP, SOB, but they are very anxiety provoking  She does not feel them through the day Outside of that, she remains very active, walks 2-42miles most days weather permitting with good exertional capacity No SOB, DOE She will randomly feel a heavy sense in her chest, this is infrequent random, short lived, no exertional or positional Planned to re-monitor with a change in her symptom behavior  She saw Dr. Lalla Brothers 09/09/21, zio monitor shows sinus rhythm during symptom triggered episodes when she reports heart flutters, etc.  Discussed perhaps ILR  10/14/21: ILR placed  Saw Dr. Allyson Sabal Nov 2023, no abnormal rhythms on loop so far, only reported c/l L knee pain  She saw A. [facility] 10/26/22, ongoing daily palpitations, upset about being told they were "in her head", also reported she had been told by a number of sources that she should never take a statin with a PCSK9i.   They reviewed this, planned for repeat lipid labs and further management decisions pending that.  The patient wanted to stop ILR monitoring Deferred to EP  I saw her 11/29/22 She is doing well, her palpitations less bothersome/worrisome  No CP, SOB She is not exercising as much, pending re-do bunion surgery No near syncope or syncope. She asks to stop monthly monitoring given to date, no findings Planned to symptom tracings and in clinic device checks only  TODAY Again she continues with some palpitations though not every day and only brief episodes that are primarily noted  only ine the early morning time. No CP, SOB, DOE No near syncope or syncope She is active, recently fostering a yellow lab. No exertional intolerances No palpitations noted with active  Device information MDT LINQII implanted 10/14/21 for palpitations   Past Medical History:  Diagnosis Date   Anxiety    Arthritis    Heart murmur    Hypertension    Palpitations     Past Surgical History:  Procedure Laterality Date   ABDOMINAL HYSTERECTOMY     APPENDECTOMY     ARTHRODESIS METATARSALPHALANGEAL JOINT (MTPJ) Left 07/02/2020   Procedure: Left hallux metatarsophalangeal joint arthrodesis;  Surgeon: Toni Arthurs, MD;  Location: [facility];  Service: Orthopedics;  Laterality: Left;   BACK SURGERY  1981   CHOLECYSTECTOMY     EYE SURGERY Bilateral    Cataract   TONSILLECTOMY     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      betamethasone dipropionate 0.05 % cream Apply topically as needed. Right foot     bisoprolol (ZEBETA) 5 MG tablet Take 1 tablet (5 mg total) by mouth daily. Pt needs to keep upcoming appt in July for further refills 90 tablet 3   Cholecalciferol (VITAMIN D3) 50 MCG (2000 UT) TABS Take 2,000 mg by mouth daily.     ibuprofen (ADVIL) 400 MG tablet Take 400 mg by mouth every 6 (six) hours as needed.     losartan (COZAAR) 50 MG tablet Take 1 tablet by mouth daily.     rosuvastatin (CRESTOR) 40 MG tablet TAKE 1 TABLET(40 MG) BY MOUTH DAILY 90 tablet 0   verapamil (CALAN-SR) 120 MG CR tablet TAKE 1 TABLET(120 MG) BY MOUTH AT BEDTIME 90 tablet 3   No current facility-administered medications for this visit.   Facility-Administered Medications Ordered in Other  Visits  Medication Dose Route Frequency Provider Last Rate Last Admin   technetium tetrofosmin (TC-MYOVIEW) injection 30.7 millicurie  30.7 millicurie Intravenous Once PRN O'Neal, Ronnald Ramp, MD        Allergies:   Cephalexin, Morphine, Penicillins, Epinephrine, Hydrochlorothiazide, Hydrocodone, Oxycodone hcl, Pseudoephedrine hcl, Zolpidem tartrate, Norco [hydrocodone-acetaminophen], Oxycodone, and Penicillin v potassium   Social History:  The patient  reports that she has never smoked. She has never used smokeless tobacco. She reports current alcohol use. She reports that she does not use drugs.   Family History:  The patient's family history includes Heart attack in her father.  ROS:  Please see the history of present illness.    All other systems are reviewed and otherwise negative.   PHYSICAL EXAM:  VS:  There were no vitals taken for this visit. BMI: There is no height or weight on file to  calculate BMI. Well nourished, well developed, in no acute distress HEENT: normocephalic, atraumatic Neck: no JVD, carotid bruits or masses Cardiac:  RRR; no significant murmurs, no rubs, or gallops Lungs: CTA b/l, no wheezing, rhonchi or rales Abd: soft, nontender MS: no deformity or atrophy Ext:  no edema Skin: warm and dry, no rash Neuro:  No gross deficits appreciated Psych: euthymic mood, full affect  ILR site is stable, skin is intact, no tethering   EKG:  not done today   ILR interrogation Battery is good R waves 0.4 No arrhythmias    05/06/22: TTE 1. Left ventricular ejection fraction, by estimation, is 60 to 65%. The  left ventricle has normal function. The left ventricle has no regional  wall motion abnormalities. Left ventricular diastolic parameters were  normal.   2. Right ventricular systolic function is normal. The right ventricular  size is normal. The estimated right ventricular systolic pressure is 29.4  mmHg.   3. The mitral valve is normal in structure. Mild  to moderate mitral valve  regurgitation. No evidence of mitral stenosis.   4. The aortic valve is tricuspid. Aortic valve regurgitation is moderate.  No aortic stenosis is present.   5. The inferior vena cava is normal in size with greater than 50%  respiratory variability, suggesting right atrial pressure of 3 mmHg.     05/05/21: TTE IMPRESSIONS   1. Left ventricular ejection fraction, by estimation, is 60 to 65%. The  left ventricle has normal function. The left ventricle has no regional  wall motion abnormalities. Left ventricular diastolic parameters were  normal.   2. Right ventricular systolic function is normal. The right ventricular  size is normal. There is normal pulmonary artery systolic pressure. The  estimated right ventricular systolic pressure is 27.8 mmHg.   3. The mitral valve is normal in structure. Mild mitral valve  regurgitation. No evidence of mitral stenosis. 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   4. The aortic valve is tricuspid. There is mild calcification of the  aortic valve. Aortic valve regurgitation is moderate. No aortic stenosis  is present.   5. The inferior vena cava is normal in size with greater than 50%  respiratory variability, suggesting right atrial pressure of 3 mmHg.    10/28/20: stress myoview Nuclear stress EF: 57%. The left ventricular ejection fraction is normal (55-65%). There was no ST segment deviation noted during stress. No T wave inversion was noted during stress. The study is normal. This is a low risk study.   1.  Reduced counts in the apical segments with normal wall motion consistent with apical thinning artifact.  No evidence of ischemia or infarction. 2.  Normal LVEF, 57%. 3.  This is a low risk study.   04/29/2020: TTE LVEF 59.85% Grade I DD Mod AI  Recent Labs: No results found for requested labs within last 365 days.  10/26/2022: Chol/HDL Ratio  2.3; Cholesterol, Total 165; HDL 71; LDL Chol Calc (NIH) 75; Triglycerides 107   CrCl cannot be calculated (Patient's most recent lab result is older than the maximum 21 days allowed.).   Wt Readings from Last 3 Encounters:  11/29/22 131 lb 6.4 oz (59.6 kg)  10/26/22 130 lb 12.8 oz (59.3 kg)  03/02/22 136 lb 6.4 oz (61.9 kg)     Other studies reviewed: Additional studies/records reviewed today include: summarized above  ASSESSMENT AND PLAN:  Atrial tachycardia ILR Planned for symptom  tracings and in clinic interrogations only, she does not want monthly monitoring Minimal palpitations No arrhythmias on her device Encouraged symptom tracings when needed. Or with any change in the behavior of her palpitations  3. HTN Looks ok  4. VHD Mod AI on her last echo Mild-mod MR  5. HLD Deferred to her PMD and cards team      Disposition: encouraged to make symptom tracing when needed, back in 49mo, sooner if needed   Current medicines are reviewed at length with the patient today.  The patient did not have any concerns regarding medicines.  Shannon Fredrickson, PA-C 03/21/2023 4:34 PM     [facility] [address] [place] [address] [phone] (office)  [phone] (fax)

## 2023-03-27 ENCOUNTER — Encounter: Payer: Self-pay | Admitting: Physician Assistant

## 2023-03-27 ENCOUNTER — Ambulatory Visit: Payer: Medicare Other | Attending: Physician Assistant | Admitting: Physician Assistant

## 2023-03-27 VITALS — BP 126/58 | HR 60 | Ht 64.0 in | Wt 127.8 lb

## 2023-03-27 DIAGNOSIS — I10 Essential (primary) hypertension: Secondary | ICD-10-CM

## 2023-03-27 DIAGNOSIS — Z9889 Other specified postprocedural states: Secondary | ICD-10-CM | POA: Diagnosis not present

## 2023-03-27 DIAGNOSIS — R002 Palpitations: Secondary | ICD-10-CM | POA: Diagnosis not present

## 2023-03-27 DIAGNOSIS — I4719 Other supraventricular tachycardia: Secondary | ICD-10-CM | POA: Diagnosis not present

## 2023-03-27 LAB — CUP PACEART INCLINIC DEVICE CHECK
Date Time Interrogation Session: 20241125123231
Implantable Pulse Generator Implant Date: 20230615

## 2023-03-27 NOTE — Patient Instructions (Signed)
Medication Instructions:  Your physician recommends that you continue on your current medications as directed. Please refer to the Current Medication list given to you today.  *If you need a refill on your cardiac medications before your next appointment, please call your pharmacy*   Lab Work: None ordered  If you have labs (blood work) drawn today and your tests are completely normal, you will receive your results only by: MyChart Message (if you have MyChart) OR A paper copy in the mail If you have any lab test that is abnormal or we need to change your treatment, we will call you to review the results.   Testing/Procedures: None ordered   Follow-Up: At [facility], you and your health needs are our priority.  As part of our continuing mission to provide you with exceptional heart care, we have created designated Provider Care Teams.  These Care Teams include your primary Cardiologist (physician) and Advanced Practice Providers (APPs -  Physician Assistants and Nurse Practitioners) who all work together to provide you with the care you need, when you need it.  We recommend signing up for the patient portal called "MyChart".  Sign up information is provided on this After Visit Summary.  MyChart is used to connect with patients for Virtual Visits (Telemedicine).  Patients are able to view lab/test results, encounter notes, upcoming appointments, etc.  Non-urgent messages can be sent to your provider as well.   To learn more about what you can do with MyChart, go to [email or web address].    Your next appointment:   6 month(s)  Provider:   You may see Lanier Prude, MD or one of the following Advanced Practice Providers on your designated Care Team:   Francis Dowse, [place] [address]" [place], [place] Sherie Don, NP Canary Brim, NP    Other Instructions

## 2023-04-10 ENCOUNTER — Other Ambulatory Visit: Payer: Self-pay | Admitting: Internal Medicine

## 2023-04-10 DIAGNOSIS — Z1231 Encounter for screening mammogram for malignant neoplasm of breast: Secondary | ICD-10-CM

## 2023-04-10 NOTE — Progress Notes (Signed)
Carelink Summary Report / Loop Recorder 

## 2023-04-18 ENCOUNTER — Ambulatory Visit: Payer: Medicare Other

## 2023-04-19 LAB — CUP PACEART REMOTE DEVICE CHECK
Date Time Interrogation Session: 20241216230801
Implantable Pulse Generator Implant Date: 20230615

## 2023-04-20 ENCOUNTER — Telehealth: Payer: Self-pay | Admitting: Cardiovascular Disease

## 2023-04-20 NOTE — Telephone Encounter (Signed)
I canceled all scheduled remotes. I assured patient she will not be charged for 04/18/2023 transmission. She do not want to be remotely monitored.

## 2023-04-20 NOTE — Telephone Encounter (Signed)
Patient stated she had canceled her remote monitoring since November and still has upcoming visits scheduled.  Patient is concerned she will be charged for the 12/17 device check.

## 2023-04-24 ENCOUNTER — Ambulatory Visit: Payer: Medicare Other

## 2023-05-02 DIAGNOSIS — M7918 Myalgia, other site: Secondary | ICD-10-CM | POA: Diagnosis not present

## 2023-05-02 DIAGNOSIS — M545 Low back pain, unspecified: Secondary | ICD-10-CM | POA: Diagnosis not present

## 2023-05-08 ENCOUNTER — Ambulatory Visit (HOSPITAL_COMMUNITY): Payer: Medicare Other

## 2023-05-22 ENCOUNTER — Ambulatory Visit (HOSPITAL_COMMUNITY): Payer: Medicare Other | Attending: Cardiology

## 2023-05-22 DIAGNOSIS — I351 Nonrheumatic aortic (valve) insufficiency: Secondary | ICD-10-CM | POA: Insufficient documentation

## 2023-05-22 DIAGNOSIS — I340 Nonrheumatic mitral (valve) insufficiency: Secondary | ICD-10-CM | POA: Insufficient documentation

## 2023-05-22 LAB — ECHOCARDIOGRAM COMPLETE
AR max vel: 1.85 cm2
AV Area VTI: 1.79 cm2
AV Area mean vel: 1.85 cm2
AV Mean grad: 12 mm[Hg]
AV Peak grad: 21.5 mm[Hg]
Ao pk vel: 2.32 m/s
Area-P 1/2: 2.68 cm2
P 1/2 time: 342 ms
S' Lateral: 2.7 cm

## 2023-05-23 ENCOUNTER — Other Ambulatory Visit (HOSPITAL_COMMUNITY): Payer: Self-pay

## 2023-05-23 DIAGNOSIS — I340 Nonrheumatic mitral (valve) insufficiency: Secondary | ICD-10-CM

## 2023-05-23 DIAGNOSIS — E782 Mixed hyperlipidemia: Secondary | ICD-10-CM

## 2023-05-23 DIAGNOSIS — I10 Essential (primary) hypertension: Secondary | ICD-10-CM

## 2023-05-23 DIAGNOSIS — I351 Nonrheumatic aortic (valve) insufficiency: Secondary | ICD-10-CM

## 2023-05-24 ENCOUNTER — Ambulatory Visit: Payer: Medicare Other

## 2023-06-04 ENCOUNTER — Other Ambulatory Visit: Payer: Self-pay | Admitting: Physician Assistant

## 2023-06-04 DIAGNOSIS — Z8249 Family history of ischemic heart disease and other diseases of the circulatory system: Secondary | ICD-10-CM

## 2023-06-04 DIAGNOSIS — E782 Mixed hyperlipidemia: Secondary | ICD-10-CM

## 2023-06-05 DIAGNOSIS — H538 Other visual disturbances: Secondary | ICD-10-CM | POA: Diagnosis not present

## 2023-06-05 DIAGNOSIS — Z961 Presence of intraocular lens: Secondary | ICD-10-CM | POA: Diagnosis not present

## 2023-06-06 DIAGNOSIS — M546 Pain in thoracic spine: Secondary | ICD-10-CM | POA: Diagnosis not present

## 2023-06-12 ENCOUNTER — Ambulatory Visit
Admission: RE | Admit: 2023-06-12 | Discharge: 2023-06-12 | Disposition: A | Discharge status: Home / Self Care | Payer: Medicare Other | Source: Ambulatory Visit | Attending: Internal Medicine | Admitting: Internal Medicine

## 2023-06-12 DIAGNOSIS — Z1231 Encounter for screening mammogram for malignant neoplasm of breast: Secondary | ICD-10-CM | POA: Diagnosis not present

## 2023-07-12 ENCOUNTER — Other Ambulatory Visit: Payer: Self-pay | Admitting: Internal Medicine

## 2023-07-12 DIAGNOSIS — E782 Mixed hyperlipidemia: Secondary | ICD-10-CM | POA: Diagnosis not present

## 2023-07-12 DIAGNOSIS — R002 Palpitations: Secondary | ICD-10-CM | POA: Diagnosis not present

## 2023-07-12 DIAGNOSIS — Z0000 Encounter for general adult medical examination without abnormal findings: Secondary | ICD-10-CM | POA: Diagnosis not present

## 2023-07-12 DIAGNOSIS — Z79899 Other long term (current) drug therapy: Secondary | ICD-10-CM | POA: Diagnosis not present

## 2023-07-12 DIAGNOSIS — M1712 Unilateral primary osteoarthritis, left knee: Secondary | ICD-10-CM | POA: Diagnosis not present

## 2023-07-12 DIAGNOSIS — E559 Vitamin D deficiency, unspecified: Secondary | ICD-10-CM | POA: Diagnosis not present

## 2023-07-12 DIAGNOSIS — I10 Essential (primary) hypertension: Secondary | ICD-10-CM | POA: Diagnosis not present

## 2023-07-12 DIAGNOSIS — Z1331 Encounter for screening for depression: Secondary | ICD-10-CM | POA: Diagnosis not present

## 2023-07-12 DIAGNOSIS — E2839 Other primary ovarian failure: Secondary | ICD-10-CM

## 2023-07-18 DIAGNOSIS — D692 Other nonthrombocytopenic purpura: Secondary | ICD-10-CM | POA: Diagnosis not present

## 2023-07-18 DIAGNOSIS — L309 Dermatitis, unspecified: Secondary | ICD-10-CM | POA: Diagnosis not present

## 2023-07-18 DIAGNOSIS — D492 Neoplasm of unspecified behavior of bone, soft tissue, and skin: Secondary | ICD-10-CM | POA: Diagnosis not present

## 2023-07-18 DIAGNOSIS — Q828 Other specified congenital malformations of skin: Secondary | ICD-10-CM | POA: Diagnosis not present

## 2023-08-29 DIAGNOSIS — L309 Dermatitis, unspecified: Secondary | ICD-10-CM | POA: Diagnosis not present

## 2023-08-29 DIAGNOSIS — B359 Dermatophytosis, unspecified: Secondary | ICD-10-CM | POA: Diagnosis not present

## 2023-09-18 ENCOUNTER — Encounter: Payer: Medicare Other | Admitting: Physician Assistant

## 2023-09-18 NOTE — Progress Notes (Signed)
 Cardiology Office Note Date:  09/18/2023  Patient ID:  Shannon [name], Shannon [name] 1945-05-10, MRN [id number] PCP:  Benedetta Bradley, MD  Cardiologist:  Dr. Katheryne Pane Electrophysiologist: Dr. Nunzio Belch >> Dr. Marven Slimmer    Chief Complaint:    6 mo   History of Present Illness: Shannon [name] is a 78 y.o. female with history of ATach, anxiety, HTN  NOTE Hx of flecainide  stopped with ongoing palpitations Metoprolol  gave her headaches   She saw A. 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 She was also advised to discuss her anxiety and insomnia with her PMD noting some nights taking Xanax  has helped her.  She saw Dr. Katheryne Pane 02/24/21, at that visit reported some improvement in in her nighttime palpitations.  I saw her 03/05/21 She agrees that she is doing better. Palpitations are less frequent and less symptomatic, shorter on the bisoprolol  No CP No exertional intolerances.  She is busy and active all day, constantly on the move, is never aware of any palpitations with exertion or in the day time She feels like now, either just as she is due to get up she feels a fast flutter, these either wake her or happen just before she wakes perhaps. No near syncope or syncope. She is due for a colonoscopy, asks if she would be ok to do a colonoscopy We reviewed her monitor tracings together today All of the patient triggered events are SR, no ectopy, normal HR, some artifact The couple PATs and one NSVT were auto triggered No changes were made, planned to see her in 73mo, sooner if needed.   I saw her  06/07/21 Generally she is doing great except she has significant palpitations. Previously she was mostly bothered by palpitations at HS, evenings keeping her from sleep, these are better with the bisoprolol , though now being woken up nearly every morning in the last months. They are fast and persistent until she gets up gets a glass of water , and by the time back to bed or sits are resolved, but are lasting minutes now. No associated symptoms, she is not dizzy, no CP, SOB, but they are very anxiety provoking  She does not feel them through the day Outside of that, she remains very active, walks 2-25miles most days weather permitting with good exertional capacity No SOB, DOE She will randomly feel a heavy sense in her chest, this is infrequent random, short lived, no exertional or positional Planned to re-monitor with a change in her symptom behavior  She saw Dr. Marven Slimmer 09/09/21, zio monitor shows sinus rhythm during symptom triggered episodes when she reports heart flutters, etc.  Discussed perhaps ILR  10/14/21: ILR placed  Saw Dr. Katheryne Pane Nov 2023, no abnormal rhythms on loop so far, only reported c/l L knee pain  She saw A. [facility] 10/26/22, ongoing daily palpitations, upset about being told they were "in her head", also reported she had been told by a number of sources that she should never take a statin with a PCSK9i.   They reviewed this, planned for repeat lipid labs and further management decisions pending that.  The patient wanted to stop ILR monitoring Deferred to EP  I saw her 11/29/22 She is doing well, her palpitations less bothersome/worrisome  No CP, SOB She is not exercising as much, pending re-do bunion surgery No near syncope or syncope. She asks to stop monthly monitoring given to date, no findings Planned to symptom tracings and in clinic device checks only  I saw her Nov 2024 Again she continues with some palpitations though not every day and only brief episodes that are  primarily noted only ine the early morning time. No CP, SOB, DOE No near syncope or syncope She is active, recently fostering a yellow lab. No exertional intolerances No palpitations noted with active No changes made  TODAY  She has daily palpitations, they are brief, predictably every morning before she is even up/out of bed Duration is not minutes, fluttering, no associated symptoms but she is aware of them Non otherwise  She had bunion surgery and 2 further foot surgeries so has not been quite as active as usual, but healed up from that and some biopsies and looking forward to getting back to her usual activities.  No near syncope or syncope No CP, SOB  Device information MDT LINQII implanted 10/14/21 for palpitations Pt requested to stop monthly monitoring   Past Medical History:  Diagnosis Date   Anxiety    Arthritis    Heart murmur    Hypertension    Palpitations     Past Surgical History:  Procedure Laterality Date   ABDOMINAL HYSTERECTOMY     APPENDECTOMY     ARTHRODESIS METATARSALPHALANGEAL JOINT (MTPJ) Left 07/02/2020   Procedure: Left hallux metatarsophalangeal joint arthrodesis;  Surgeon: Amada Backer, MD;  Location: [facility];  Service: Orthopedics;  Laterality: Left;   BACK SURGERY  1981   CHOLECYSTECTOMY     EYE SURGERY Bilateral    Cataract   TONSILLECTOMY     TOTAL KNEE ARTHROPLASTY Left 09/26/2019   Procedure: TOTAL KNEE ARTHROPLASTY;  Surgeon: Claiborne Crew, MD;  Location: [facility];  Service: Orthopedics;  Laterality: Left;  70 mins   WRIST SURGERY Right     Current Outpatient Medications  Medication Sig Dispense Refill   ALPRAZolam  (XANAX ) 0.25 MG tablet Take 0.125-0.25 mg by mouth at bedtime.      betamethasone dipropionate 0.05 % cream Apply topically as needed. Right foot     bisoprolol  (ZEBETA ) 5 MG tablet Take 1 tablet (5 mg total) by mouth daily. Pt needs to keep upcoming appt in July for further refills 90 tablet 3    Cholecalciferol (VITAMIN D3) 50 MCG (2000 UT) TABS Take 2,000 mg by mouth daily.     ibuprofen (ADVIL) 400 MG tablet Take 400 mg by mouth every 6 (six) hours as needed.     losartan  (COZAAR ) 50 MG tablet Take 1 tablet by mouth daily.     rosuvastatin  (CRESTOR ) 40 MG tablet TAKE 1 TABLET(40 MG) BY MOUTH DAILY 90 tablet 2   verapamil  (CALAN -SR) 120 MG CR tablet TAKE 1 TABLET(120 MG) BY MOUTH AT BEDTIME 90 tablet 3   No current facility-administered medications for this visit.   Facility-Administered Medications Ordered in Other Visits  Medication Dose Route Frequency Provider Last Rate Last Admin   technetium tetrofosmin  (TC-MYOVIEW ) injection 30.7 millicurie  30.7 millicurie Intravenous Once PRN O'Neal, Cathay Clonts, MD        Allergies:   Cephalexin, Morphine, Penicillins, Epinephrine , Hydrochlorothiazide , Hydrocodone , Oxycodone  hcl, Pseudoephedrine hcl, Zolpidem tartrate, Norco [hydrocodone -acetaminophen ], Oxycodone , and Penicillin v  potassium   Social History:  The patient  reports that she has never smoked. She has never used smokeless tobacco. She reports current alcohol use. She reports that she does not use drugs.   Family History:  The patient's family history includes Heart attack in her father.  ROS:  Please see the history of present illness.    All other systems are reviewed and otherwise negative.   PHYSICAL EXAM:  VS:  There were no vitals taken for this visit. BMI: There is no height or weight on file to calculate BMI. Well nourished, well developed, in no acute distress HEENT: normocephalic, atraumatic Neck: no JVD, carotid bruits or masses Cardiac:  RRR; no significant murmurs, no rubs, or gallops Lungs: CTA b/l, no wheezing, rhonchi or rales Abd: soft, nontender MS: no deformity or atrophy Ext:  no edema Skin: warm and dry, no rash Neuro:  No gross deficits appreciated Psych: euthymic mood, full affect  ILR site is stable, skin is intact, no tethering   EKG:   done today and reviewed by myself SB 54bpm   ILR interrogation Battery is good No observations    05/22/23: TTE 1. Left ventricular ejection fraction, by estimation, is 55 to 60%. The  left ventricle has normal function. The left ventricle has no regional  wall motion abnormalities. Left ventricular diastolic parameters are  consistent with Grade I diastolic  dysfunction (impaired relaxation). The average left ventricular global  longitudinal strain is -22.8 %. The global longitudinal strain is normal.   2. Right ventricular systolic function is normal. The right ventricular  size is normal. There is normal pulmonary artery systolic pressure. The  estimated right ventricular systolic pressure is 31.5 mmHg.   3. The mitral valve is normal in structure. Mild mitral valve  regurgitation. No evidence of mitral stenosis.   4. The aortic valve is tricuspid. There is mild calcification of the  aortic valve. Aortic valve regurgitation is moderate. Mild aortic valve  stenosis. Aortic valve area, by VTI measures 1.79 cm. Aortic valve mean  gradient measures 12.0 mmHg.   5. The inferior vena cava is normal in size with greater than 50%  respiratory variability, suggesting right atrial pressure of 3 mmHg.   05/06/22: TTE 1. Left ventricular ejection fraction, by estimation, is 60 to 65%. The  left ventricle has normal function. The left ventricle has no regional  wall motion abnormalities. Left ventricular diastolic parameters were  normal.   2. Right ventricular systolic function is normal. The right ventricular  size is normal. The estimated right ventricular systolic pressure is 29.4  mmHg.   3. The mitral valve is normal in structure. Mild to moderate mitral valve  regurgitation. No evidence of mitral stenosis.   4. The aortic valve is tricuspid. Aortic valve regurgitation is moderate.  No aortic stenosis is present.   5. The inferior vena cava is normal in size with greater than 50%   respiratory variability, suggesting right atrial pressure of 3 mmHg.     05/05/21: TTE IMPRESSIONS   1. Left ventricular ejection fraction, by estimation, is 60 to 65%. The  left ventricle has normal function. The left ventricle has no regional  wall motion abnormalities. Left ventricular diastolic parameters were  normal.   2. Right ventricular systolic function is normal. The right ventricular  size is normal. There is normal pulmonary artery systolic pressure. The  estimated right ventricular systolic pressure is 27.8 mmHg.   3. The mitral valve is normal in structure. Mild  mitral valve  regurgitation. No evidence of mitral stenosis. 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   4. The aortic valve is tricuspid. There is mild calcification of the  aortic valve. Aortic valve regurgitation is moderate. No aortic stenosis  is present.   5. The inferior vena cava is normal in size with greater than 50%  respiratory variability, suggesting right atrial pressure of 3 mmHg.    10/28/20: stress myoview  Nuclear stress EF: 57%. The left ventricular ejection fraction is normal (55-65%). There was no ST segment deviation noted during stress. No T wave inversion was noted during stress. The study is normal. This is a low risk study.   1.  Reduced counts in the apical segments with normal wall motion consistent with apical thinning artifact.  No evidence of ischemia or infarction. 2.  Normal LVEF, 57%. 3.  This is a low risk study.   04/29/2020: TTE LVEF 59.85% Grade I DD Mod AI  Recent Labs: No results found for requested labs within last 365 days.  10/26/2022: Chol/HDL Ratio 2.3; Cholesterol, Total 165; HDL 71; LDL Chol Calc (NIH) 75; Triglycerides 107   CrCl cannot be calculated (Patient's most recent lab result is older than the maximum 21 days allowed.).   Wt Readings from Last 3 Encounters:  03/27/23 127 lb 12.8 oz  (58 kg)  11/29/22 131 lb 6.4 oz (59.6 kg)  10/26/22 130 lb 12.8 oz (59.3 kg)     Other studies reviewed: Additional studies/records reviewed today include: summarized above  ASSESSMENT AND PLAN:  Atrial tachycardia ILR Planned for symptom tracings and in clinic interrogations only, she does not want monthly monitoring No arrhythmias on her device She has a symptom activator but hasn't made any tracings with her palpitations She is worried that the daily palpitations will have a negative effect on her heart I have asked her to make symptom tracing so we can see what she is feeling to better assess   3. HTN Looks ok  4. VHD Mod AI on her last echo Mild MR  5. HLD Deferred to her PMD and cards team      Disposition: back in 36mo, sooner if needed   Current medicines are reviewed at length with the patient today.  The patient did not have any concerns regarding medicines.  [place], PA-C 09/18/2023 8:50 AM     [facility] [address] [place] [address] [phone] (office)  [phone] (fax)

## 2023-09-19 ENCOUNTER — Encounter: Payer: Self-pay | Admitting: Physician Assistant

## 2023-09-19 ENCOUNTER — Ambulatory Visit: Attending: Physician Assistant | Admitting: Physician Assistant

## 2023-09-19 VITALS — BP 102/58 | HR 54 | Ht 64.0 in | Wt 132.0 lb

## 2023-09-19 DIAGNOSIS — I4719 Other supraventricular tachycardia: Secondary | ICD-10-CM

## 2023-09-19 DIAGNOSIS — I10 Essential (primary) hypertension: Secondary | ICD-10-CM

## 2023-09-19 DIAGNOSIS — Z4509 Encounter for adjustment and management of other cardiac device: Secondary | ICD-10-CM

## 2023-09-19 LAB — CUP PACEART INCLINIC DEVICE CHECK
Date Time Interrogation Session: 20250520131930
Implantable Pulse Generator Implant Date: 20230615

## 2023-09-19 NOTE — Patient Instructions (Signed)
 Medication Instructions:    Your physician recommends that you continue on your current medications as directed. Please refer to the Current Medication list given to you today.   *If you need a refill on your cardiac medications before your next appointment, please call your pharmacy*   Lab Work: NONE ORDERED  TODAY    If you have labs (blood work) drawn today and your tests are completely normal, you will receive your results only by: MyChart Message (if you have MyChart) OR A paper copy in the mail If you have any lab test that is abnormal or we need to change your treatment, we will call you to review the results.    Testing/Procedures: NONE ORDERED  TODAY     Follow-Up: At [facility], you and your health needs are our priority.  As part of our continuing mission to provide you with exceptional heart care, our providers are all part of one team.  This team includes your primary Cardiologist (physician) and Advanced Practice Providers or APPs (Physician Assistants and Nurse Practitioners) who all work together to provide you with the care you need, when you need it.  Your next appointment:    6 month(s)   Provider:    You may see Boyce Byes, MD or one of the following Advanced Practice Providers on your designated Care Team:   Mertha Abrahams, [place]      We recommend signing up for the patient portal called "MyChart".  Sign up information is provided on this After Visit Summary.  MyChart is used to connect with patients for Virtual Visits (Telemedicine).  Patients are able to view lab/test results, encounter notes, upcoming appointments, etc.  Non-urgent messages can be sent to your provider as well.   To learn more about what you can do with MyChart, go to [email or web address].   Other Instructions

## 2023-10-10 ENCOUNTER — Other Ambulatory Visit: Payer: Self-pay | Admitting: *Deleted

## 2023-10-10 MED ORDER — VERAPAMIL HCL ER 120 MG PO TBCR: EXTENDED_RELEASE_TABLET | ORAL | 0 refills | Status: DC

## 2023-10-30 ENCOUNTER — Encounter: Payer: Self-pay | Admitting: Cardiovascular Disease

## 2023-10-30 ENCOUNTER — Ambulatory Visit: Attending: Cardiovascular Disease | Admitting: Cardiovascular Disease

## 2023-10-30 VITALS — BP 110/60 | HR 53 | Ht 64.0 in | Wt 131.4 lb

## 2023-10-30 DIAGNOSIS — I351 Nonrheumatic aortic (valve) insufficiency: Secondary | ICD-10-CM

## 2023-10-30 DIAGNOSIS — R931 Abnormal findings on diagnostic imaging of heart and coronary circulation: Secondary | ICD-10-CM | POA: Insufficient documentation

## 2023-10-30 DIAGNOSIS — E782 Mixed hyperlipidemia: Secondary | ICD-10-CM

## 2023-10-30 DIAGNOSIS — I471 Supraventricular tachycardia, unspecified: Secondary | ICD-10-CM

## 2023-10-30 DIAGNOSIS — I10 Essential (primary) hypertension: Secondary | ICD-10-CM

## 2023-10-30 NOTE — Assessment & Plan Note (Signed)
 History of moderate aortic insufficiency with 2D echo performed 05/22/2023 revealing normal LV systolic function, grade 1 diastolic dysfunction with moderate aortic insufficiency and mild aortic stenosis.  This will be repeated on an annual basis.

## 2023-10-30 NOTE — Assessment & Plan Note (Signed)
 History of hyperlipidemia on high-dose statin therapy with lipid profile performed 07/12/2023 revealing total cholesterol 155, LDL of 70 and HDL 70.  This is appropriate for secondary prevention.

## 2023-10-30 NOTE — Progress Notes (Signed)
 10/30/2023 Shannon [name]   03/06/1946  [id number]  Primary Physician Charlott Dorn LABOR, MD Primary Cardiologist: Dorn [name], [name]  HPI:  Shannon [name] is a 78 y.o.     thin appearing married Caucasian female mother of 1 son, grandmother and 1 grandchild who was referred by Dr. Dorcas Bolder for symptomatic tachypalpitations.  I last saw her in the office 03/02/2022.  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  She never smoked.  There is a family history for heart disease with a father that had myocardial infarction.  She has treated hypertension hyperlipidemia.  She is never had a heart attack or stroke.  She denies chest pain or shortness of breath.  She has noticed the onset of nightly tachypalpitations began in the early a.m. hours and lasting on and off until she wakes up in the morning.  Recent Zio patch revealed runs of nonsustained ventricular tachycardia and SVT.  2D echo performed in December revealed normal LV systolic function with moderate AI unchanged from an echo performed in 2016.  She says during these episodes she does feel some fullness in her chest.  Lab work read by Dr. Bolder  was apparently unremarkable.  I referred her to Dr. Kelsie who saw her on 11/26/2020.  He changed her from metoprolol  to bisoprolol  and added flecainide .  She is scheduled to see a EP APP sometime in the next couple weeks.  Her nocturnal palpitations have somewhat improved in frequency and severity.  Because of a coronary calcium  score of 12 I did increase her Crestor  to daily however her LDL only came down from 94-87.  I prefer her LDL be less than 70.     She ultimately saw Dr. Ole Holts who implanted a loop recorder in June 2023.  Last several readings have shown no events.  She denies chest pain or shortness of breath.  Since I saw her a year and a half ago she is remained stable.  Her palpitations have been less  noticeable.  She denies chest pain or shortness of breath.  She has had some surgical procedures on her left foot which is limited her mobility.  2D echocardiogram performed 05/22/2023 revealed normal LV systolic function, grade 1 diastolic dysfunction, moderate aortic insufficiency which is stable, mild aortic stenosis.    Allergies  Allergen Reactions   Cephalexin Hives and Other (See Comments)    Pt received ancef  07/02/2020 and did well   Morphine Other (See Comments) and Palpitations   Penicillins Hives    Pt received ancef  on 07/02/2020 with no reaction   Epinephrine  Palpitations and Other (See Comments)    Other reaction(s): palpitations   Hydrochlorothiazide      Other reaction(s): weakness   Hydrocodone  Hives   Oxycodone  Hcl Hives   Pseudoephedrine Hcl     Other reaction(s): increased hyperactivity   Zolpidem Tartrate Diarrhea   Norco [Hydrocodone -Acetaminophen ] Hives   Oxycodone  Hives   Penicillin V Potassium Rash    Social History   Socioeconomic History   Marital status: Married    Spouse name: Not on file   Number of children: Not on file   Years of education: Not on file   Highest education level: Not on file  Occupational History   Not on file  Tobacco Use   Smoking status: Never   Smokeless tobacco: Never  Vaping Use   Vaping status: Never Used  Substance and  Sexual Activity   Alcohol use: Yes    Comment: occasional   Drug use: Never   Sexual activity: Not on file  Other Topics Concern   Not on file  Social History Narrative   Not on file   Social Drivers of Health   Financial Resource Strain: Not on file  Food Insecurity: Not on file  Transportation Needs: Not on file  Physical Activity: Not on file  Stress: Not on file  Social Connections: Not on file  Intimate Partner Violence: Not on file     Review of Systems: General: negative for chills, fever, night sweats or weight changes.  Cardiovascular: negative for chest pain, dyspnea on exertion,  edema, orthopnea, palpitations, paroxysmal nocturnal dyspnea or shortness of breath Dermatological: negative for rash Respiratory: negative for cough or wheezing Urologic: negative for hematuria Abdominal: negative for nausea, vomiting, diarrhea, bright red blood per rectum, melena, or hematemesis Neurologic: negative for visual changes, syncope, or dizziness All other systems reviewed and are otherwise negative except as noted above.    Blood pressure 110/60, pulse (!) 53, height 5' 4 (1.626 m), weight 131 lb 6.4 oz (59.6 kg), SpO2 99%.  General appearance: alert and no distress Neck: no adenopathy, no carotid bruit, no JVD, supple, symmetrical, trachea midline, and thyroid  not enlarged, symmetric, no tenderness/mass/nodules Lungs: clear to auscultation bilaterally Heart: regular rate and rhythm, S1, S2 normal, no murmur, click, rub or gallop Extremities: extremities normal, atraumatic, no cyanosis or edema Pulses: 2+ and symmetric Skin: Skin color, texture, turgor normal. No rashes or lesions Neurologic: Grossly normal  EKG not performed today      ASSESSMENT AND PLAN:   SVT (supraventricular tachycardia) (HCC) Event monitor in 2023 did show atrial tachycardia.  She did have a loop recorder implanted by Dr. Cindie Junior 23.  Her palpitations apparently have gotten much less noticeable since I last saw her.  She is on bisoprolol ' and verapamil .  Essential hypertension History of essential hypertension with blood pressure measured today 110/60.  She is on bisoprolol  and verapamil .  Hyperlipidemia History of hyperlipidemia on high-dose statin therapy with lipid profile performed 07/12/2023 revealing total cholesterol 155, LDL of 70 and HDL 70.  This is appropriate for secondary prevention.  Aortic insufficiency History of moderate aortic insufficiency with 2D echo performed 05/22/2023 revealing normal LV systolic function, grade 1 diastolic dysfunction with moderate aortic  insufficiency and mild aortic stenosis.  This will be repeated on an annual basis.  Elevated coronary artery calcium  score [name]  score performed 11/24/2020 was 12 all in the RCA.  She is completely asymptomatic and at goal for secondary prevention.     Dorn DOROTHA Lesches MD FACP,FACC,FAHA, [facility] 10/30/2023 10:23 AM

## 2023-10-30 NOTE — Assessment & Plan Note (Signed)
 History of essential hypertension with blood pressure measured today 110/60.  She is on bisoprolol  and verapamil .

## 2023-10-30 NOTE — Assessment & Plan Note (Signed)
 Shannon [name] calcium  score performed 11/24/2020 was 12 all in the RCA.  She is completely asymptomatic and at goal for secondary prevention.

## 2023-10-30 NOTE — Patient Instructions (Signed)
 Medication Instructions:  No changes  *If you need a refill on your cardiac medications before your next appointment, please call your pharmacy*   Lab Work: Not needed    Testing/Procedures: Jan 2026 Your physician has requested that you have an echocardiogram. Echocardiography is a painless test that uses sound waves to create images of your heart. It provides your doctor with information about the size and shape of your heart and how well your heart's chambers and valves are working. This procedure takes approximately one hour. There are no restrictions for this procedure. Please do NOT wear cologne, perfume, aftershave, or lotions (deodorant is allowed). Please arrive 15 minutes prior to your appointment time.  Please note: We ask at that you not bring children with you during ultrasound (echo/ vascular) testing. Due to room size and safety concerns, children are not allowed in the ultrasound rooms during exams. Our front office staff cannot provide observation of children in our lobby area while testing is being conducted. An adult accompanying a patient to their appointment will only be allowed in the ultrasound room at the discretion of the ultrasound technician under special circumstances. We apologize for any inconvenience.    Follow-Up: At [facility], you and your health needs are our priority.  As part of our continuing mission to provide you with exceptional heart care, we have created designated Provider Care Teams.  These Care Teams include your primary Cardiologist (physician) and Advanced Practice Providers (APPs -  Physician Assistants and Nurse Practitioners) who all work together to provide you with the care you need, when you need it.     Your next appointment:   12 month(s)  The format for your next appointment:   In Person  Provider:   Dorn Lesches, MD

## 2023-10-30 NOTE — Assessment & Plan Note (Signed)
 Event monitor in 2023 did show atrial tachycardia.  She did have a loop recorder implanted by Dr. Cindie Junior 23.  Her palpitations apparently have gotten much less noticeable since I last saw her.  She is on bisoprolol ' and verapamil .

## 2023-11-07 DIAGNOSIS — M5459 Other low back pain: Secondary | ICD-10-CM | POA: Diagnosis not present

## 2023-11-20 DIAGNOSIS — M791 Myalgia, unspecified site: Secondary | ICD-10-CM | POA: Diagnosis not present

## 2023-11-20 DIAGNOSIS — M5459 Other low back pain: Secondary | ICD-10-CM | POA: Diagnosis not present

## 2023-12-19 DIAGNOSIS — M791 Myalgia, unspecified site: Secondary | ICD-10-CM | POA: Diagnosis not present

## 2023-12-19 DIAGNOSIS — M5459 Other low back pain: Secondary | ICD-10-CM | POA: Diagnosis not present

## 2023-12-28 ENCOUNTER — Other Ambulatory Visit (HOSPITAL_COMMUNITY): Payer: Self-pay

## 2023-12-28 ENCOUNTER — Other Ambulatory Visit: Payer: Self-pay | Admitting: Physician Assistant

## 2023-12-28 DIAGNOSIS — K080 Exfoliation of teeth due to systemic causes: Secondary | ICD-10-CM | POA: Diagnosis not present

## 2023-12-28 MED ORDER — BISOPROLOL FUMARATE 5 MG PO TABS
5.0000 mg | ORAL_TABLET | Freq: Every day | ORAL | 3 refills | Status: DC
  Filled 2023-12-28: qty 90, 90d supply, fill #0

## 2023-12-29 ENCOUNTER — Other Ambulatory Visit (HOSPITAL_COMMUNITY): Payer: Self-pay

## 2023-12-29 ENCOUNTER — Telehealth: Payer: Self-pay | Admitting: Cardiovascular Disease

## 2023-12-29 MED ORDER — BISOPROLOL FUMARATE 5 MG PO TABS: 5.0000 mg | ORAL_TABLET | Freq: Every day | ORAL | 2 refills | Status: AC

## 2023-12-29 NOTE — Telephone Encounter (Signed)
 Pt's medication was sent to pt's pharmacy as requested. Confirmation received.

## 2023-12-29 NOTE — Telephone Encounter (Signed)
 *  STAT* If patient is at the pharmacy, call can be transferred to refill team.   1. Which medications need to be refilled? (please list name of each medication and dose if known)   bisoprolol  (ZEBETA ) 5 MG tablet     2. Would you like to learn more about the convenience, safety, & potential cost savings by using the [facility]? No      3. Are you open to using the [facility] (Type [facility]. ). No    4. Which pharmacy/location (including street and city if local pharmacy) is medication to be sent to?  [id number] - [place], [place] - [address] AT [facility] [address]     5. Do they need a 30 day or 90 day supply? 90 days   [facility]. patient requested to resend it to her pharmacy at [facility]

## 2024-01-09 ENCOUNTER — Other Ambulatory Visit: Payer: Self-pay | Admitting: Cardiovascular Disease

## 2024-01-15 ENCOUNTER — Other Ambulatory Visit: Payer: Self-pay | Admitting: Cardiovascular Disease

## 2024-01-15 ENCOUNTER — Telehealth: Payer: Self-pay | Admitting: Cardiovascular Disease

## 2024-01-15 ENCOUNTER — Other Ambulatory Visit (HOSPITAL_COMMUNITY): Payer: Self-pay

## 2024-01-15 MED ORDER — VERAPAMIL HCL ER 120 MG PO TBCR
120.0000 mg | EXTENDED_RELEASE_TABLET | Freq: Every day | ORAL | 2 refills | Status: AC
  Filled 2024-01-15: qty 90, 90d supply, fill #0
  Filled 2024-04-08: qty 90, 90d supply, fill #1

## 2024-01-15 NOTE — Telephone Encounter (Signed)
 Patient is asking that you give her a call back because she thought there is a recall on medication and she is unable to get [facility]. Please advise

## 2024-01-15 NOTE — Telephone Encounter (Signed)
 I spoke with patient. She has been on Verapamil  SR for many years.  Was told by her pharmacy that manufacturer is no longer making this medication.  Patient has one tablet left.  Will check with pharmacist in office.

## 2024-01-15 NOTE — Telephone Encounter (Signed)
 Shared message from triage nurse with patient: Medication is available at [facility] pharmacy at [facility].  Refill has been sent to this pharmacy.  I placed call to patient and left message to call office.  Pharmacist has confirmed lot available at [facility] was not included in recall    Patient states she does not understand why she has been told the manufacturer sent out a nationwide recall for this medication and it is not available at any pharmacy yet our pharmacy has it available. She is concerned that because there is a recall she does not want to take medication that will not work for her. I restated our pharmacist states the supply of Verapamil  our pharmacy has in stock was not included in the recall.  Patient then states our pharmacy is not a convenient location for her. She states she will come by tomorrow to pick-up and discuss concerns with pharmacist on staff.  Patient would also like to know if Dr. Court would recommend anything for her to take instead of the verapamil  if she is not able to continue taking it. Will forward message to Dr. Court to review and advise.

## 2024-01-15 NOTE — Telephone Encounter (Signed)
 Medication is available at [facility] pharmacy at [facility].  Refill has been sent to this pharmacy.  I placed call to patient and left message to call office.  Pharmacist has confirmed lot available at [facility] was not included in recall

## 2024-01-15 NOTE — Telephone Encounter (Signed)
 Pt c/o medication issue:  1. Name of Medication: verapamil  (CALAN -SR) 120 MG CR tablet   2. How are you currently taking this medication (dosage and times per day)? As written   3. Are you having a reaction (difficulty breathing--STAT)? No   4. What is your medication issue? Pt requesting a c/b she was told by the pharmacy this medication has a recall and they can no longer sell it to her.

## 2024-01-22 ENCOUNTER — Telehealth (HOSPITAL_BASED_OUTPATIENT_CLINIC_OR_DEPARTMENT_OTHER): Payer: Self-pay

## 2024-02-05 ENCOUNTER — Other Ambulatory Visit (HOSPITAL_BASED_OUTPATIENT_CLINIC_OR_DEPARTMENT_OTHER)

## 2024-02-25 ENCOUNTER — Other Ambulatory Visit: Payer: Self-pay | Admitting: Physician Assistant

## 2024-02-25 DIAGNOSIS — Z8249 Family history of ischemic heart disease and other diseases of the circulatory system: Secondary | ICD-10-CM

## 2024-02-25 DIAGNOSIS — E782 Mixed hyperlipidemia: Secondary | ICD-10-CM

## 2024-03-13 NOTE — Progress Notes (Unsigned)
 Cardiology Office Note Date:  03/13/2024  Patient ID:  Shannon [name] 05-Dec-1945, MRN [id number] PCP:  Charlott Dorn LABOR, MD  Cardiologist:  Dr. Court Electrophysiologist: Dr. Kelsie >> Dr. Cindie    Chief Complaint:    6 mo   History of Present Illness: Shannon [name] is a 78 y.o. female with history of  ATach,  anxiety, HTN  NOTE Hx of flecainide  stopped with ongoing palpitations Metoprolol  gave her headaches   She saw A. 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 She was also advised to discuss her anxiety and insomnia with her PMD noting some nights taking Xanax  has helped her.  She saw Dr. Court 02/24/21, at that visit reported some improvement in in her nighttime palpitations.  I saw her 03/05/21 She agrees that she is doing better. Palpitations are less frequent and less symptomatic, shorter on the bisoprolol  No CP No exertional intolerances.  She is busy and active all day, constantly on the move, is never aware of any palpitations with exertion or in the day time She feels like now, either just as she is due to get up she feels a fast flutter, these either wake her or happen just before she wakes perhaps. No near syncope or syncope. She is due for a colonoscopy, asks if she would be ok to do a colonoscopy We reviewed her monitor tracings together today All of the patient triggered events are SR, no ectopy, normal HR, some artifact The couple PATs and one NSVT were auto triggered No changes were made, planned to see her in 21mo, sooner if needed.   I saw her  06/07/21 Generally she is doing great except she has significant palpitations. Previously she was mostly bothered by palpitations at HS, evenings keeping her from sleep, these are better with the bisoprolol , though now being woken up nearly every morning in the last months. They are fast and persistent until she gets up gets a glass of water , and by the time back to bed or sits are resolved, but are lasting minutes now. No associated symptoms, she is not dizzy, no CP, SOB, but they are very anxiety provoking  She does not feel them through the day Outside of that, she remains very active, walks 2-42miles most days weather permitting with good exertional capacity No SOB, DOE She will randomly feel a heavy sense in her chest, this is infrequent random, short lived, no exertional or positional Planned to re-monitor with a change in her symptom behavior  She saw Dr. Cindie 09/09/21, zio monitor shows sinus rhythm during symptom triggered episodes when she reports heart flutters, etc.  Discussed perhaps ILR  10/14/21: ILR placed  Saw Dr. Court Nov 2023, no abnormal rhythms on loop so far, only reported c/l L knee pain  She saw A. Duke 10/26/22, ongoing daily palpitations, upset about being told they were in her head, also reported she had been told by a number of sources that she should never take a statin with a PCSK9i.   They reviewed this, planned for repeat lipid labs and further management decisions  pending that. The patient wanted to stop ILR monitoring Deferred to EP  I saw her 11/29/22 She is doing well, her palpitations less bothersome/worrisome  No CP, SOB She is not exercising as much, pending re-do bunion surgery No near syncope or syncope. She asks to stop monthly monitoring given to date, no findings Planned to symptom tracings and in clinic device checks only  I saw her Nov 2024 Again she continues with some palpitations though not every day and only brief episodes that are  primarily noted only ine the early morning time. No CP, SOB, DOE No near syncope or syncope She is active, recently fostering a yellow lab. No exertional intolerances No palpitations noted with active No changes made  I saw her 09/19/23 She has daily palpitations, they are brief, predictably every morning before she is even up/out of bed Duration is not minutes, fluttering, no associated symptoms but she is aware of them None otherwise She had bunion surgery and 2 further foot surgeries so has not been quite as active as usual, but healed up from that and some biopsies and looking forward to getting back to her usual activities. No near syncope or syncope No CP, SOB She wanted to stop monthly monitoring  She saw Dr. Court 10/30/23 Less palpitations Doing well post foot surgery    TODAY  She is doing well Palpitations mostly limited to when she is laying on her left side No CP, SOB She is very active, though no formal exercise given a bad knee, and R foot problems (despite a number of surgeries) No DOE No near syncope or syncope  She is concerned about noted easy bruising, (none on torso), no significant trauma > mild bumps causing bruising She also has concerns of an area on her RLE where she had a biopsy sone some time ago > healed, but an unusual, scaly area that is puritic  This all deferred to her PMD Recommended she reach out to discuss > not wait until her annual visit next year  Device information MDT LINQII implanted 10/14/21 for palpitations Pt requested to stop monthly monitoring   Past Medical History:  Diagnosis Date   Anxiety    Arthritis    Heart murmur    Hypertension    Palpitations     Past Surgical History:  Procedure Laterality Date   ABDOMINAL HYSTERECTOMY     APPENDECTOMY     ARTHRODESIS METATARSALPHALANGEAL JOINT (MTPJ) Left 07/02/2020   Procedure: Left hallux metatarsophalangeal joint arthrodesis;  Surgeon: Kit Rush, MD;  Location: [facility];  Service: Orthopedics;  Laterality: Left;   BACK SURGERY  1981   CHOLECYSTECTOMY     EYE SURGERY Bilateral    Cataract   TONSILLECTOMY     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      bisoprolol  (ZEBETA ) 5 MG tablet Take 1 tablet (5 mg total) by mouth daily. 90 tablet 2   Cholecalciferol (VITAMIN D3) 50 MCG (2000 UT) TABS Take 2,000 mg by mouth daily.     losartan  (COZAAR ) 50 MG tablet Take 1 tablet by mouth daily.     rosuvastatin  (CRESTOR ) 40 MG tablet TAKE 1 TABLET(40 MG) BY  MOUTH DAILY 90 tablet 2   verapamil  (CALAN -SR) 120 MG CR tablet Take 1 tablet (120 mg total) by mouth at bedtime. 90 tablet 2   No current facility-administered medications for this visit.   Facility-Administered Medications Ordered in Other Visits  Medication Dose Route Frequency Provider Last Rate Last Admin   technetium tetrofosmin  (TC-MYOVIEW ) injection 30.7 millicurie  30.7 millicurie Intravenous Once PRN O'Neal, Darryle Ned, MD        Allergies:   Cephalexin, Morphine, Penicillins, Epinephrine , Hydrochlorothiazide , Hydrocodone , Oxycodone  hcl, Pseudoephedrine hcl, Zolpidem tartrate, Norco [hydrocodone -acetaminophen ], Oxycodone , and Penicillin v potassium   Social History:  The patient  reports that she has never smoked. She has never used smokeless tobacco. She reports current alcohol use. She reports that she does not use drugs.   Family History:  The patient's family history includes Heart attack in her father.  ROS:  Please see the history of present illness.    All other systems are reviewed and otherwise negative.   PHYSICAL EXAM:  VS:  There were no vitals taken for this visit.  BMI: There is no height or weight on file to calculate BMI. Well nourished, well developed, in no acute distress HEENT: normocephalic, atraumatic Neck: no JVD, carotid bruits or masses Cardiac: RRR; no significant murmurs, no rubs, or gallops Lungs: CTA b/l, no wheezing, rhonchi or rales Abd: soft, nontender MS: no deformity or atrophy Ext: no edema Skin: warm and dry, no rash Neuro:  No gross deficits appreciated Psych: euthymic mood, full affect  ILR site is stable, skin is intact, no tethering   EKG:  not done today   ILR interrogation Battery is good No  device observations, arrhythmias    05/22/23: TTE 1. Left ventricular ejection fraction, by estimation, is 55 to 60%. The  left ventricle has normal function. The left ventricle has no regional  wall motion abnormalities. Left ventricular diastolic parameters are  consistent with Grade I diastolic  dysfunction (impaired relaxation). The average left ventricular global  longitudinal strain is -22.8 %. The global longitudinal strain is normal.   2. Right ventricular systolic function is normal. The right ventricular  size is normal. There is normal pulmonary artery systolic pressure. The  estimated right ventricular systolic pressure is 31.5 mmHg.   3. The mitral valve is normal in structure. Mild mitral valve  regurgitation. No evidence of mitral stenosis.   4. The aortic valve is tricuspid. There is mild calcification of the  aortic valve. Aortic valve regurgitation is moderate. Mild aortic valve  stenosis. Aortic valve area, by VTI measures 1.79 cm. Aortic valve mean  gradient measures 12.0 mmHg.   5. The inferior vena cava is normal in size with greater than 50%  respiratory variability, suggesting right atrial pressure of 3 mmHg.   05/06/22: TTE 1. Left ventricular ejection fraction, by estimation, is 60 to 65%. The  left ventricle has normal function. The left ventricle has no regional  wall motion abnormalities.  Left ventricular diastolic parameters were  normal.   2. Right ventricular systolic function is normal. The right ventricular  size is normal. The estimated right ventricular systolic pressure is 29.4  mmHg.   3. The mitral valve is normal in structure. Mild to moderate mitral valve  regurgitation. No evidence of mitral stenosis.   4. The aortic valve is tricuspid. Aortic valve regurgitation is moderate.  No aortic stenosis is present.   5. The inferior vena cava is normal in size with greater than 50%  respiratory variability,  suggesting right atrial pressure of 3 mmHg.     05/05/21: TTE IMPRESSIONS   1. Left ventricular ejection fraction, by estimation, is 60 to 65%. The  left ventricle has normal function. The left ventricle has no regional  wall motion abnormalities. Left ventricular diastolic parameters were  normal.   2. Right ventricular systolic function is normal. The right ventricular  size is normal. There is normal pulmonary artery systolic pressure. The  estimated right ventricular systolic pressure is 27.8 mmHg.   3. The mitral valve is normal in structure. Mild mitral valve  regurgitation. No evidence of mitral stenosis. 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   4. The aortic valve is tricuspid. There is mild calcification of the  aortic valve. Aortic valve regurgitation is moderate. No aortic stenosis  is present.   5. The inferior vena cava is normal in size with greater than 50%  respiratory variability, suggesting right atrial pressure of 3 mmHg.    10/28/20: stress myoview  Nuclear stress EF: 57%. The left ventricular ejection fraction is normal (55-65%). There was no ST segment deviation noted during stress. No T wave inversion was noted during stress. The study is normal. This is a low risk study.   1.  Reduced counts in the apical segments with normal wall motion consistent with apical  thinning artifact.  No evidence of ischemia or infarction. 2.  Normal LVEF, 57%. 3.  This is a low risk study.   04/29/2020: TTE LVEF 59.85% Grade I DD Mod AI  Recent Labs: No results found for requested labs within last 365 days.  No results found for requested labs within last 365 days.   CrCl cannot be calculated (Patient's most recent lab result is older than the maximum 21 days allowed.).   Wt Readings from Last 3 Encounters:  10/30/23 131 lb 6.4 oz (59.6 kg)  09/19/23 132 lb (59.9 kg)  03/27/23 127 lb 12.8 oz (58 kg)     Other studies reviewed: Additional studies/records reviewed today include: summarized above  ASSESSMENT AND PLAN:  Atrial tachycardia ILR Pt has declined monthly monitoring None on her ILR interrogation today Discussed need for Q74mo in clinic visits  3. 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 HLD Deferred to her PMD and cards team      Disposition: back in 71mo, sooner if needed   Current medicines are reviewed at length with the patient today.  The patient did not have any concerns regarding medicines.  Shannon Charlies Arthur, PA-C 03/13/2024 4:25 PM     [facility] [address] [place] [address] [phone] (office)  [phone] (fax)

## 2024-03-15 ENCOUNTER — Ambulatory Visit: Attending: Physician Assistant | Admitting: Physician Assistant

## 2024-03-15 ENCOUNTER — Encounter: Payer: Self-pay | Admitting: Physician Assistant

## 2024-03-15 VITALS — BP 132/58 | HR 56 | Ht 64.0 in | Wt 129.0 lb

## 2024-03-15 DIAGNOSIS — Z4509 Encounter for adjustment and management of other cardiac device: Secondary | ICD-10-CM

## 2024-03-15 DIAGNOSIS — I4719 Other supraventricular tachycardia: Secondary | ICD-10-CM

## 2024-03-15 DIAGNOSIS — I10 Essential (primary) hypertension: Secondary | ICD-10-CM | POA: Diagnosis not present

## 2024-03-15 LAB — CUP PACEART INCLINIC DEVICE CHECK
Date Time Interrogation Session: 20251114131529
Implantable Pulse Generator Implant Date: 20230615

## 2024-03-15 NOTE — Patient Instructions (Addendum)
 Medication Instructions:    Your physician recommends that you continue on your current medications as directed. Please refer to the Current Medication list given to you today.  *If you need a refill on your cardiac medications before your next appointment, please call your pharmacy*   Lab Work: NONE ORDERED  TODAY     If you have labs (blood work) drawn today and your tests are completely normal, you will receive your results only by: MyChart Message (if you have MyChart) OR A paper copy in the mail If you have any lab test that is abnormal or we need to change your treatment, we will call you to review the results.    Testing/Procedures: NONE ORDERED  TODAY    Follow-Up: At [facility], you and your health needs are our priority.  As part of our continuing mission to provide you with exceptional heart care, our providers are all part of one team.  This team includes your primary Cardiologist (physician) and Advanced Practice Providers or APPs (Physician Assistants and Nurse Practitioners) who all work together to provide you with the care you need, when you need it.  Your next appointment:    3 month(s)    Provider:  Charlies Arthur  ( CONTACT  CASSIE HALL/ ANGELINE HAMMER FOR EP SCHEDULING ISSUES )      We recommend signing up for the patient portal called MyChart.  Sign up information is provided on this After Visit Summary.  MyChart is used to connect with patients for Virtual Visits (Telemedicine).  Patients are able to view lab/test results, encounter notes, upcoming appointments, etc.  Non-urgent messages can be sent to your provider as well.   To learn more about what you can do with MyChart, go to [email or web address].    Other Instructions

## 2024-03-21 ENCOUNTER — Other Ambulatory Visit

## 2024-04-01 DIAGNOSIS — M5459 Other low back pain: Secondary | ICD-10-CM | POA: Diagnosis not present

## 2024-04-29 ENCOUNTER — Other Ambulatory Visit (HOSPITAL_BASED_OUTPATIENT_CLINIC_OR_DEPARTMENT_OTHER)

## 2024-04-30 ENCOUNTER — Other Ambulatory Visit (HOSPITAL_COMMUNITY)

## 2024-05-03 ENCOUNTER — Other Ambulatory Visit: Payer: Self-pay | Admitting: Internal Medicine

## 2024-05-03 DIAGNOSIS — Z1231 Encounter for screening mammogram for malignant neoplasm of breast: Secondary | ICD-10-CM

## 2024-05-13 ENCOUNTER — Ambulatory Visit (HOSPITAL_COMMUNITY)
Admission: RE | Admit: 2024-05-13 | Discharge: 2024-05-13 | Disposition: A | Discharge status: Home / Self Care | Source: Ambulatory Visit | Attending: Internal Medicine | Admitting: Internal Medicine

## 2024-05-13 ENCOUNTER — Ambulatory Visit: Payer: Self-pay | Admitting: Cardiovascular Disease

## 2024-05-13 DIAGNOSIS — I351 Nonrheumatic aortic (valve) insufficiency: Secondary | ICD-10-CM

## 2024-05-13 DIAGNOSIS — I10 Essential (primary) hypertension: Secondary | ICD-10-CM

## 2024-05-13 DIAGNOSIS — I340 Nonrheumatic mitral (valve) insufficiency: Secondary | ICD-10-CM | POA: Insufficient documentation

## 2024-05-13 DIAGNOSIS — E782 Mixed hyperlipidemia: Secondary | ICD-10-CM

## 2024-05-13 LAB — ECHOCARDIOGRAM COMPLETE: S' Lateral: 3.17 cm

## 2024-06-12 ENCOUNTER — Ambulatory Visit

## 2024-06-24 ENCOUNTER — Ambulatory Visit: Admitting: Physician Assistant

## 2024-06-26 ENCOUNTER — Ambulatory Visit
